# Patient Record
Sex: Female | Born: 1995 | Race: White | Hispanic: No | Marital: Single | Smoking: Current every day smoker
Health system: Southern US, Community
[De-identification: ages and names within clinical notes are randomized; demographics above are authoritative.]

## PROBLEM LIST (undated history)

## (undated) ENCOUNTER — Inpatient Hospital Stay (HOSPITAL_COMMUNITY): Payer: Self-pay

## (undated) DIAGNOSIS — F319 Bipolar disorder, unspecified: Secondary | ICD-10-CM

## (undated) DIAGNOSIS — F329 Major depressive disorder, single episode, unspecified: Secondary | ICD-10-CM

## (undated) DIAGNOSIS — L905 Scar conditions and fibrosis of skin: Secondary | ICD-10-CM

## (undated) DIAGNOSIS — F32A Depression, unspecified: Secondary | ICD-10-CM

## (undated) DIAGNOSIS — B999 Unspecified infectious disease: Secondary | ICD-10-CM

## (undated) DIAGNOSIS — T50902A Poisoning by unspecified drugs, medicaments and biological substances, intentional self-harm, initial encounter: Secondary | ICD-10-CM

## (undated) DIAGNOSIS — B1920 Unspecified viral hepatitis C without hepatic coma: Secondary | ICD-10-CM

## (undated) DIAGNOSIS — F419 Anxiety disorder, unspecified: Secondary | ICD-10-CM

## (undated) DIAGNOSIS — B009 Herpesviral infection, unspecified: Secondary | ICD-10-CM

## (undated) HISTORY — DX: Anxiety disorder, unspecified: F41.9

## (undated) HISTORY — PX: APPENDECTOMY: SHX54

## (undated) HISTORY — PX: HERNIA REPAIR: SHX51

## (undated) HISTORY — PX: TONSILLECTOMY: SUR1361

## (undated) HISTORY — DX: Bipolar disorder, unspecified: F31.9

---
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
## 2015-10-08 ENCOUNTER — Encounter (HOSPITAL_COMMUNITY): Payer: Self-pay | Admitting: Emergency Medicine

## 2015-10-08 ENCOUNTER — Emergency Department (HOSPITAL_COMMUNITY)
Admission: EM | Admit: 2015-10-08 | Discharge: 2015-10-08 | Discharge status: Against Medical Advice | Payer: Medicaid Other | Attending: Emergency Medicine | Admitting: Emergency Medicine

## 2015-10-08 DIAGNOSIS — N611 Abscess of the breast and nipple: Secondary | ICD-10-CM | POA: Insufficient documentation

## 2015-10-08 NOTE — ED Notes (Signed)
Patient c/o abscess to right breast x2 weeks. Approximately 4" in width, redness and warm to touch, no drainage. Denies N/V, fever. Rates pain 8/10.

## 2015-10-09 ENCOUNTER — Emergency Department (HOSPITAL_BASED_OUTPATIENT_CLINIC_OR_DEPARTMENT_OTHER)
Admission: EM | Admit: 2015-10-09 | Discharge: 2015-10-10 | Disposition: A | Discharge status: Home / Self Care | Payer: Medicaid Other | Attending: Emergency Medicine | Admitting: Emergency Medicine

## 2015-10-09 ENCOUNTER — Encounter (HOSPITAL_BASED_OUTPATIENT_CLINIC_OR_DEPARTMENT_OTHER): Payer: Self-pay | Admitting: *Deleted

## 2015-10-09 DIAGNOSIS — J069 Acute upper respiratory infection, unspecified: Secondary | ICD-10-CM | POA: Insufficient documentation

## 2015-10-09 DIAGNOSIS — Z915 Personal history of self-harm: Secondary | ICD-10-CM | POA: Insufficient documentation

## 2015-10-09 DIAGNOSIS — Z79899 Other long term (current) drug therapy: Secondary | ICD-10-CM | POA: Insufficient documentation

## 2015-10-09 DIAGNOSIS — F329 Major depressive disorder, single episode, unspecified: Secondary | ICD-10-CM | POA: Insufficient documentation

## 2015-10-09 DIAGNOSIS — R000 Tachycardia, unspecified: Secondary | ICD-10-CM | POA: Insufficient documentation

## 2015-10-09 DIAGNOSIS — N611 Abscess of the breast and nipple: Secondary | ICD-10-CM | POA: Insufficient documentation

## 2015-10-09 DIAGNOSIS — Z880 Allergy status to penicillin: Secondary | ICD-10-CM | POA: Insufficient documentation

## 2015-10-09 DIAGNOSIS — B9789 Other viral agents as the cause of diseases classified elsewhere: Secondary | ICD-10-CM

## 2015-10-09 DIAGNOSIS — Z872 Personal history of diseases of the skin and subcutaneous tissue: Secondary | ICD-10-CM | POA: Insufficient documentation

## 2015-10-09 LAB — CBC
HEMATOCRIT: 36.6 % (ref 36.0–46.0)
HEMOGLOBIN: 12 g/dL (ref 12.0–15.0)
MCH: 29.2 pg (ref 26.0–34.0)
MCHC: 32.8 g/dL (ref 30.0–36.0)
MCV: 89.1 fL (ref 78.0–100.0)
Platelets: 390 10*3/uL (ref 150–400)
RBC: 4.11 MIL/uL (ref 3.87–5.11)
RDW: 11.9 % (ref 11.5–15.5)
WBC: 14.5 10*3/uL — ABNORMAL HIGH (ref 4.0–10.5)

## 2015-10-09 LAB — BASIC METABOLIC PANEL
Anion gap: 6 (ref 5–15)
BUN: 11 mg/dL (ref 6–20)
CHLORIDE: 105 mmol/L (ref 101–111)
CO2: 27 mmol/L (ref 22–32)
Calcium: 8.6 mg/dL — ABNORMAL LOW (ref 8.9–10.3)
Creatinine, Ser: 0.63 mg/dL (ref 0.44–1.00)
GFR calc Af Amer: >60 mL/min (ref >60)
GFR calc non Af Amer: >60 mL/min (ref >60)
GLUCOSE: 110 mg/dL — AB (ref 65–99)
POTASSIUM: 3.3 mmol/L — AB (ref 3.5–5.1)
Sodium: 138 mmol/L (ref 135–145)

## 2015-10-09 MED ORDER — FENTANYL CITRATE (PF) 100 MCG/2ML IJ SOLN: 50.0000 ug | Freq: Once | INTRAMUSCULAR | Status: DC

## 2015-10-09 MED ORDER — OXYCODONE-ACETAMINOPHEN 5-325 MG PO TABS
2.0000 | ORAL_TABLET | Freq: Once | ORAL | Status: AC
  Administered 2015-10-09: 2 via ORAL
  Filled 2015-10-09: qty 2

## 2015-10-09 MED ORDER — SODIUM CHLORIDE 0.9 % IV BOLUS (SEPSIS)
500.0000 mL | Freq: Once | INTRAVENOUS | Status: AC
  Administered 2015-10-09: 500 mL via INTRAVENOUS

## 2015-10-09 MED ORDER — OXYCODONE-ACETAMINOPHEN 5-325 MG PO TABS: 1.0000 | ORAL_TABLET | ORAL | Status: DC | PRN

## 2015-10-09 MED ORDER — CEFTRIAXONE SODIUM 1 G IJ SOLR
1.0000 g | Freq: Once | INTRAMUSCULAR | Status: AC
  Administered 2015-10-09: 1 g via INTRAMUSCULAR
  Filled 2015-10-09: qty 10

## 2015-10-09 MED ORDER — FENTANYL CITRATE (PF) 100 MCG/2ML IJ SOLN
25.0000 ug | Freq: Once | INTRAMUSCULAR | Status: AC
  Administered 2015-10-09: 25 ug via INTRAVENOUS
  Filled 2015-10-09: qty 2

## 2015-10-09 MED ORDER — CLINDAMYCIN HCL 150 MG PO CAPS: 450.0000 mg | ORAL_CAPSULE | Freq: Three times a day (TID) | ORAL | Status: DC

## 2015-10-09 MED ORDER — LIDOCAINE HCL (PF) 1 % IJ SOLN
INTRAMUSCULAR | Status: DC
Start: 2015-10-09 — End: 2015-10-10
  Filled 2015-10-09: qty 5

## 2015-10-09 NOTE — ED Notes (Signed)
Pt registration states patient states she is leaving. Pt informed of right to stay and be treated. Pt doesn't want to wait any longer.

## 2015-10-09 NOTE — Discharge Instructions (Signed)
1. Medications: Percocet, clindamycin, usual home medications 2. Treatment: rest, drink plenty of fluids, 3. Follow Up: Please call Dr. Ermalene SearingMartin's office at 9:10am TOMORROW to schedule further treatment

## 2015-10-09 NOTE — ED Provider Notes (Signed)
CSN: [id number]     Arrival date & time 10/09/15  1718 History   First MD Initiated Contact with Patient 10/09/15 2027     Chief Complaint  Patient presents with  . Abscess     (Consider location/radiation/quality/duration/timing/severity/associated sxs/prior Treatment) The history is provided by the patient, a significant other and medical records. No language interpreter was used.    Lisa Schmitt is a 19 y.o. female  with a hx of depression, suicide attempt presents to the Emergency Department complaining of gradual, persistent, progressively worsening right breast abscess onset 2 weeks ago. Associated symptoms include redness, swelling and tenderness to the area.  Patient denies treatments prior to arrival. Nothing makes it better and nothing makes it worse.  Pt denies fever, chills, headache, neck pain, chest pain, shortness of breath, abdominal pain, nausea, vomiting, diarrhea, weakness, dizziness, syncope.  Patient also endorses 24 hours of sneezing, cough, rhinorrhea, nasal congestion, sore throat and postnasal drip.       Past Medical History  Diagnosis Date  . Depression   . Suicide attempt by drug ingestion ([facility]CC) 03/2012  . Scar of abdomen 03/2012   Past Surgical History  Procedure Laterality Date  . Appendectomy  age 60 yrs.  . Scar revision  03/22/2012    Procedure: SCAR REVISION;  Surgeon: Wayland Denislaire Sanger, DO;  Location: [facility];  Service: Plastics;  Laterality: Right;  Excision painful abdominal appendix scar contracture   No family history on file. Social History  Substance Use Topics  . Smoking status: Passive Smoke Exposure - Never Smoker  . Smokeless tobacco: Never Used     Comment: outside smokers at home  . Alcohol Use: No   OB History    No data available     Review of Systems  Constitutional: Positive for fatigue. Negative for fever, chills and appetite change.  HENT: Positive for congestion, postnasal drip, rhinorrhea, sinus pressure  and sore throat. Negative for ear discharge, ear pain and mouth sores.   Eyes: Negative for visual disturbance.  Respiratory: Positive for cough. Negative for chest tightness, shortness of breath, wheezing and stridor.   Cardiovascular: Negative for chest pain, palpitations and leg swelling.  Gastrointestinal: Negative for nausea, vomiting, abdominal pain and diarrhea.  Genitourinary: Negative for dysuria, urgency, frequency and hematuria.  Musculoskeletal: Negative for myalgias, back pain, arthralgias and neck stiffness.  Skin: Negative for rash.       Right breast abscess  Neurological: Negative for syncope, light-headedness, numbness and headaches.  Hematological: Negative for adenopathy.  Psychiatric/Behavioral: The patient is not nervous/anxious.   All other systems reviewed and are negative.     Allergies  Vicodin and Penicillins  Home Medications   Prior to Admission medications   Medication Sig Start Date End Date Taking? Authorizing Provider  ARIPiprazole (ABILIFY) 2 MG tablet Take 2 mg by mouth daily.    Historical Provider, MD  atomoxetine (STRATTERA) 80 MG capsule Take 80 mg by mouth daily.    Historical Provider, MD  citalopram (CELEXA) 40 MG tablet Take 40 mg by mouth daily.    Historical Provider, MD  clindamycin (CLEOCIN) 150 MG capsule Take 3 capsules (450 mg total) by mouth 3 (three) times daily. 10/09/15   Kitana Gage, PA-C  oxyCODONE-acetaminophen (PERCOCET) 5-325 MG tablet Take 1-2 tablets by mouth every 4 (four) hours as needed. 10/09/15   Yamaira Spinner, PA-C  topiramate (TOPAMAX) 50 MG tablet Take 50 mg by mouth 2 (two) times daily.    Historical Provider, MD  traZODone (DESYREL) 50 MG tablet Take 50 mg by mouth at bedtime.    Historical Provider, MD   BP 105/51 mmHg  Pulse 110  Temp(Src) 98.4 F (36.9 C) (Oral)  Resp 18  Ht  (1.575 m)  Wt 115 lb (52.164 kg)  BMI 21.03 kg/m2  SpO2 98%  LMP 10/01/2015 Physical Exam  Constitutional: She  is oriented to person, place, and time. She appears well-developed and well-nourished. No distress.  HENT:  Head: Normocephalic and atraumatic.  Right Ear: Tympanic membrane, external ear and ear canal normal.  Left Ear: Tympanic membrane, external ear and ear canal normal.  Nose: Mucosal edema and rhinorrhea present. No epistaxis. Right sinus exhibits no maxillary sinus tenderness and no frontal sinus tenderness. Left sinus exhibits no maxillary sinus tenderness and no frontal sinus tenderness.  Mouth/Throat: Uvula is midline, oropharynx is clear and moist and mucous membranes are normal. Mucous membranes are not pale and not cyanotic. No oropharyngeal exudate, posterior oropharyngeal edema, posterior oropharyngeal erythema or tonsillar abscesses.  Eyes: Conjunctivae are normal. Pupils are equal, round, and reactive to light.  Neck: Normal range of motion and full passive range of motion without pain.  Cardiovascular: Normal rate and intact distal pulses.   Pulmonary/Chest: Effort normal and breath sounds normal. No stridor.  Clear and equal breath sounds without focal wheezes, rhonchi, rales  Abdominal: Soft. Bowel sounds are normal. There is no tenderness.  Musculoskeletal: Normal range of motion.  Lymphadenopathy:    She has no cervical adenopathy.  Neurological: She is alert and oriented to person, place, and time.  Skin: Skin is warm and dry. No rash noted. She is not diaphoretic. There is erythema.  3 x 6 cm area of induration and fluctuance to the lower inner quadrant of the right breast with extension into the areola  Psychiatric: She has a normal mood and affect.  Nursing note and vitals reviewed.   ED Course  Procedures (including critical care time) Labs Review Labs Reviewed  CBC  BASIC METABOLIC PANEL    Imaging Review No results found. I have personally reviewed and evaluated these images and lab results as part of my medical decision-making.   EKG  Interpretation None       EMERGENCY DEPARTMENT US SOFT TISSUE INTERPRETATION "Study: Limited Ultrasound of the noted body part in comments below"  INDICATIONS: Pain and Soft tissue infection Multiple views of the body part are obtained with a multi-frequency linear probe  PERFORMED BY:  Myself  IMAGES ARCHIVED?: Yes  SIDE:Right   BODY PART:Breast  FINDINGS: Abcess present  LIMITATIONS:  Body Habitus  INTERPRETATION:  Abcess present  COMMENT:  Large abscess noted on ultrasound of the right breast    MDM   Final diagnoses:  Abscess of right breast  Viral URI with cough   Lisa Slate presents with right breast abscess.  Patient is well-appearing. In triage she was tachycardic however no tachycardia on physical exam. No hypotension or fever noted.  9:37 PM Discussed with Dr. Daphine Deutscher who recommends Rocephin 1g IM and d/c home with clindamycin.  She is to call his office at 9:10am and they will schedule a drainage tomorrow.  Patient pain controlled in the ED.  Strict return precautions given.  10:21 PM Pt remains tachycardic.  Will check basic labs, give fluid and further pain control.  Care transferred to Dr. Cyndie Chime at shift change.  Anticipate discharge home with surgery follow-up in the AM.  BP 105/51 mmHg  Pulse 110  Temp(Src)  98.4 F (36.9 C) (Oral)  Resp 18  Ht  (1.575 m)  Wt 115 lb (52.164 kg)  BMI 21.03 kg/m2  SpO2 98%  LMP 10/01/2015   Dierdre Forth, PA-C 10/09/15 2244  Leta Baptist, MD 10/10/15 [phone]

## 2015-10-09 NOTE — ED Notes (Signed)
Abscess to her right breast x 2 weeks. Red, painful, swollen.

## 2015-10-09 NOTE — ED Notes (Signed)
Pt given warm blanket. Pt states she doesn't feel right. Patient on phone and friend at side. Explained to patient wait and that I would be happy to check her vitals signs again. Pt states she thinks that the abscess burst inside her body.NAD noted at this time. This nurse will continue to monitor patient and symptoms.

## 2015-10-09 NOTE — ED Notes (Signed)
Pt informed of continued wait time. Pt resting with blanket and friend at side. NAD noted.

## 2015-10-10 MED ORDER — HYDROMORPHONE HCL 1 MG/ML IJ SOLN
1.0000 mg | Freq: Once | INTRAMUSCULAR | Status: AC
  Administered 2015-10-10: 1 mg via INTRAVENOUS
  Filled 2015-10-10: qty 1

## 2016-01-18 ENCOUNTER — Emergency Department (HOSPITAL_COMMUNITY): Payer: Self-pay

## 2016-01-18 ENCOUNTER — Emergency Department (HOSPITAL_COMMUNITY)
Admission: EM | Admit: 2016-01-18 | Discharge: 2016-01-18 | Disposition: A | Discharge status: Home / Self Care | Payer: Self-pay | Attending: Emergency Medicine | Admitting: Emergency Medicine

## 2016-01-18 ENCOUNTER — Encounter (HOSPITAL_COMMUNITY): Payer: Self-pay

## 2016-01-18 DIAGNOSIS — N12 Tubulo-interstitial nephritis, not specified as acute or chronic: Secondary | ICD-10-CM | POA: Insufficient documentation

## 2016-01-18 DIAGNOSIS — Z8659 Personal history of other mental and behavioral disorders: Secondary | ICD-10-CM | POA: Insufficient documentation

## 2016-01-18 DIAGNOSIS — Z915 Personal history of self-harm: Secondary | ICD-10-CM | POA: Insufficient documentation

## 2016-01-18 DIAGNOSIS — Z3202 Encounter for pregnancy test, result negative: Secondary | ICD-10-CM | POA: Insufficient documentation

## 2016-01-18 DIAGNOSIS — Z880 Allergy status to penicillin: Secondary | ICD-10-CM | POA: Insufficient documentation

## 2016-01-18 DIAGNOSIS — Z872 Personal history of diseases of the skin and subcutaneous tissue: Secondary | ICD-10-CM | POA: Insufficient documentation

## 2016-01-18 LAB — CBC
HEMATOCRIT: 38.1 % (ref 36.0–46.0)
Hemoglobin: 12.1 g/dL (ref 12.0–15.0)
MCH: 28.7 pg (ref 26.0–34.0)
MCHC: 31.8 g/dL (ref 30.0–36.0)
MCV: 90.5 fL (ref 78.0–100.0)
PLATELETS: 354 10*3/uL (ref 150–400)
RBC: 4.21 MIL/uL (ref 3.87–5.11)
RDW: 13.8 % (ref 11.5–15.5)
WBC: 17.8 10*3/uL — AB (ref 4.0–10.5)

## 2016-01-18 LAB — BASIC METABOLIC PANEL
Anion gap: 12 (ref 5–15)
BUN: 12 mg/dL (ref 6–20)
CALCIUM: 9.1 mg/dL (ref 8.9–10.3)
CO2: 25 mmol/L (ref 22–32)
Chloride: 102 mmol/L (ref 101–111)
Creatinine, Ser: 0.58 mg/dL (ref 0.44–1.00)
GFR calc Af Amer: >60 mL/min (ref >60)
Glucose, Bld: 101 mg/dL — ABNORMAL HIGH (ref 65–99)
POTASSIUM: 4.7 mmol/L (ref 3.5–5.1)
SODIUM: 139 mmol/L (ref 135–145)

## 2016-01-18 LAB — URINALYSIS, ROUTINE W REFLEX MICROSCOPIC
Bilirubin Urine: NEGATIVE
Glucose, UA: NEGATIVE mg/dL
Ketones, ur: NEGATIVE mg/dL
Nitrite: POSITIVE — AB
PROTEIN: 30 mg/dL — AB
Specific Gravity, Urine: 1.013 (ref 1.005–1.030)
pH: 7 (ref 5.0–8.0)

## 2016-01-18 LAB — URINE MICROSCOPIC-ADD ON

## 2016-01-18 LAB — HEPATIC FUNCTION PANEL
ALBUMIN: 3.2 g/dL — AB (ref 3.5–5.0)
ALK PHOS: 76 U/L (ref 38–126)
ALT: 14 U/L (ref 14–54)
AST: 27 U/L (ref 15–41)
Bilirubin, Direct: 0.3 mg/dL (ref 0.1–0.5)
Indirect Bilirubin: 0.4 mg/dL (ref 0.3–0.9)
TOTAL PROTEIN: 6.1 g/dL — AB (ref 6.5–8.1)
Total Bilirubin: 0.7 mg/dL (ref 0.3–1.2)

## 2016-01-18 LAB — POC URINE PREG, ED: PREG TEST UR: NEGATIVE

## 2016-01-18 MED ORDER — HYDROMORPHONE HCL 1 MG/ML IJ SOLN
1.0000 mg | Freq: Once | INTRAMUSCULAR | Status: AC
  Administered 2016-01-18: 1 mg via INTRAVENOUS
  Filled 2016-01-18: qty 1

## 2016-01-18 MED ORDER — OXYCODONE-ACETAMINOPHEN 5-325 MG PO TABS: 1.0000 | ORAL_TABLET | ORAL | Status: DC | PRN

## 2016-01-18 MED ORDER — CIPROFLOXACIN HCL 500 MG PO TABS: 500.0000 mg | ORAL_TABLET | Freq: Two times a day (BID) | ORAL | Status: DC

## 2016-01-18 MED ORDER — ONDANSETRON HCL 4 MG PO TABS: 4.0000 mg | ORAL_TABLET | Freq: Four times a day (QID) | ORAL | Status: DC

## 2016-01-18 MED ORDER — DEXTROSE 5 % IV SOLN
1.0000 g | Freq: Once | INTRAVENOUS | Status: AC
  Administered 2016-01-18: 1 g via INTRAVENOUS
  Filled 2016-01-18: qty 10

## 2016-01-18 MED ORDER — SODIUM CHLORIDE 0.9 % IV BOLUS (SEPSIS)
1000.0000 mL | Freq: Once | INTRAVENOUS | Status: AC
  Administered 2016-01-18: 1000 mL via INTRAVENOUS

## 2016-01-18 MED ORDER — ONDANSETRON HCL 4 MG/2ML IJ SOLN
4.0000 mg | Freq: Once | INTRAMUSCULAR | Status: AC
  Administered 2016-01-18: 4 mg via INTRAVENOUS
  Filled 2016-01-18: qty 2

## 2016-01-18 NOTE — ED Provider Notes (Signed)
CSN: [id number]     Arrival date & time 01/18/16  4098 History   First MD Initiated Contact with Patient 01/18/16 0601     Chief Complaint  Patient presents with  . Flank Pain     (Consider location/radiation/quality/duration/timing/severity/associated sxs/prior Treatment) HPI Lisa Schmitt is a 20 y.o. 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 She reports gradual worsening pain of her right upper back that radiates into her right leg. She reports pain is constant but is intermittently sharp and excruciating. Hunching over and seems to lessen symptom severity. Nothing seems to make it better otherwise. Movement and palpation worsen her discomfort. She does report painful urination with one episode of emesis earlier yesterday, nonbloody and nonbilious. She denies any fevers at home, abdominal pain, numbness or weakness. No other modifying factors.  Past Medical History  Diagnosis Date  . Depression   . Suicide attempt by drug ingestion (HCC) 03/2012  . Scar of abdomen 03/2012   Past Surgical History  Procedure Laterality Date  . Appendectomy  age 69 yrs.  . Scar revision  03/22/2012    Procedure: SCAR REVISION;  Surgeon: Wayland Denis, DO;  Location: [facility];  Service: Plastics;  Laterality: Right;  Excision painful abdominal appendix scar contracture   No family history on file. Social History  Substance Use Topics  . Smoking status: Passive Smoke Exposure - Never Smoker  . Smokeless tobacco: Never Used     Comment: outside smokers at home  . Alcohol Use: No   OB History    No data available     Review of Systems A 10 point review of systems was completed and was negative except for pertinent positives and negatives as mentioned in the history of present illness     Allergies  Vicodin and Penicillins  Home Medications   Prior to Admission  medications   Medication Sig Start Date End Date Taking? Authorizing Provider  acetaminophen (TYLENOL) 500 MG tablet Take 1,000-1,500 mg by mouth every 6 (six) hours as needed for mild pain.   Yes Historical Provider, MD  Phenazopyridine HCl (AZO TABS PO) Take 1 tablet by mouth daily as needed (pain).   Yes Historical Provider, MD  ciprofloxacin (CIPRO) 500 MG tablet Take 1 tablet (500 mg total) by mouth 2 (two) times daily. 01/18/16   Joycie Peek, PA-C  ondansetron (ZOFRAN) 4 MG tablet Take 1 tablet (4 mg total) by mouth every 6 (six) hours. 01/18/16   Joycie Peek, PA-C  oxyCODONE-acetaminophen (PERCOCET/ROXICET) 5-325 MG tablet Take 1-2 tablets by mouth every 4 (four) hours as needed for severe pain. 01/18/16   Joycie Peek, PA-C   BP 117/81 mmHg  Pulse 102  Temp(Src) 97.7 F (36.5 C) (Oral)  Resp 13  Ht  (1.575 m)  Wt 65.772 kg  BMI 26.51 kg/m2  SpO2 98%  LMP 12/21/2015 Physical Exam  Constitutional: She is oriented to person, place, and time. She appears well-developed and well-nourished.  HENT:  Head: Normocephalic and atraumatic.  Mouth/Throat: Oropharynx is clear and moist.  Eyes: Conjunctivae are normal. Pupils are equal, round, and reactive to light. Right eye exhibits no discharge. Left eye exhibits no discharge. No scleral icterus.  Neck: Neck supple.  Cardiovascular: Normal rate, regular rhythm and normal heart sounds.   Pulmonary/Chest: Effort normal and breath sounds normal. No respiratory distress. She has no wheezes. She  has no rales.  Abdominal: Soft. She exhibits no distension. There is no rebound and no guarding.  Diffuse suprapubic tenderness. Abdomen is otherwise soft, nondistended without rebound or guarding  Musculoskeletal:  Tenderness diffusely throughout right paraspinal musculature specifically over thoracic spine. No crepitus, erythema or other abnormalities noted. Gait is antalgic but no ataxia. Able to stand on toes without difficulty.  Difficult to appreciate right-sided CVA tenderness.  Neurological: She is alert and oriented to person, place, and time. No cranial nerve deficit. Coordination normal.  Cranial Nerves II-XII grossly intact. Motor strength 5/5 all 4 extremities. Sensation intact to light touch  Skin: Skin is warm and dry. No rash noted.  Psychiatric: She has a normal mood and affect.  Nursing note and vitals reviewed.   ED Course  Procedures (including critical care time) Labs Review Labs Reviewed  URINALYSIS, ROUTINE W REFLEX MICROSCOPIC (NOT AT [facility]) - Abnormal; Notable for the following:    Color, Urine ORANGE (*)    APPearance CLOUDY (*)    Hgb urine dipstick LARGE (*)    Protein, ur 30 (*)    Nitrite POSITIVE (*)    Leukocytes, UA LARGE (*)    All other components within normal limits  BASIC METABOLIC PANEL - Abnormal; Notable for the following:    Glucose, Bld 101 (*)    All other components within normal limits  CBC - Abnormal; Notable for the following:    WBC 17.8 (*)    All other components within normal limits  HEPATIC FUNCTION PANEL - Abnormal; Notable for the following:    Total Protein 6.1 (*)    Albumin 3.2 (*)    All other components within normal limits  URINE MICROSCOPIC-ADD ON - Abnormal; Notable for the following:    Squamous Epithelial / LPF 6-30 (*)    Bacteria, UA MANY (*)    All other components within normal limits  URINE CULTURE  POC URINE PREG, ED    Imaging Review Ct Renal Stone Study  01/18/2016  CLINICAL DATA:  Left flank pain for 1 week.  Pain with urination. EXAM: CT ABDOMEN AND PELVIS WITHOUT CONTRAST TECHNIQUE: Multidetector CT imaging of the abdomen and pelvis was performed following the standard protocol without IV contrast. COMPARISON:  10/06/2011 FINDINGS: Lower chest: Peripheral densities at both lung bases likely related to atelectasis. Hepatobiliary: Normal appearance of liver and gallbladder. Pancreas: Normal appearance of the pancreas without  inflammation or duct dilatation. Spleen: Normal appearance of the spleen without enlargement. Adrenals/Urinary Tract: Normal appearance of the adrenal glands. Normal appearance of both kidneys without hydronephrosis or stones. There is concern for bladder wall thickening, particularly along the anterior wall, measuring up to 1 cm. Stomach/Bowel: Large amount of stool throughout the colon. No gross abnormality to the stomach or small bowel. Evidence for an appendectomy. Vascular/Lymphatic: No significant atherosclerotic calcifications in the aorta or iliac arteries. Negative for an aortic aneurysm. No significant lymphadenopathy. Reproductive: Limited evaluation of the uterus and adnexa on this non contrast examination. Other: Negative for free fluid. No free air. Probable postsurgical changes along the anterior abdominal wall near the umbilicus. Musculoskeletal:  No acute abnormality. IMPRESSION: Diffuse wall thickening in the urinary bladder. Findings raise concern for bladder inflammation and cystitis. Negative for kidney stones or hydronephrosis. Electronically Signed   By: Richarda Overlie M.D.   On: 01/18/2016 08:13   I have personally reviewed and evaluated these images and lab results as part of my medical decision-making.   EKG Interpretation None  Meds given in ED:  Medications  sodium chloride 0.9 % bolus 1,000 mL (0 mLs Intravenous Stopped 01/18/16 0843)  HYDROmorphone (DILAUDID) injection 1 mg (1 mg Intravenous Given 01/18/16 0644)  HYDROmorphone (DILAUDID) injection 1 mg (1 mg Intravenous Given 01/18/16 0842)  ondansetron (ZOFRAN) injection 4 mg (4 mg Intravenous Given 01/18/16 0842)  cefTRIAXone (ROCEPHIN) 1 g in dextrose 5 % 50 mL IVPB (0 g Intravenous Stopped 01/18/16 0913)    Discharge Medication List as of 01/18/2016  8:32 AM    START taking these medications   Details  ciprofloxacin (CIPRO) 500 MG tablet Take 1 tablet (500 mg total) by mouth 2 (two) times daily., Starting 01/18/2016,  Until Discontinued, Print    ondansetron (ZOFRAN) 4 MG tablet Take 1 tablet (4 mg total) by mouth every 6 (six) hours., Starting 01/18/2016, Until Discontinued, Print    oxyCODONE-acetaminophen (PERCOCET/ROXICET) 5-325 MG tablet Take 1-2 tablets by mouth every 4 (four) hours as needed for severe pain., Starting 01/18/2016, Until Discontinued, Print       Filed Vitals:   01/18/16 0536 01/18/16 0545 01/18/16 0845 01/18/16 0915  BP:   121/82 117/81  Pulse:  126 107 102  Temp:      TempSrc:      Resp:   13 13  Height:      Weight: 65.772 kg     SpO2:  98% 98% 98%    MDM  Lisa Schmitt is a 20 y.o. female who presents for evaluation of right-sided flank and back pain as well as painful urination. Patient does have some tenderness along her paraspinal musculature, however has a nonfocal neuro exam. Due to patient's history of IV drug abuse, epidural abscess was considered but this is unlikely. She also has some suprapubic tenderness, urinalysis shows evidence of infection. Leukocytosis 17.8. Pregnancy is negative. Labs are otherwise unremarkable. CT renal stone study shows evidence of bladder wall thickening with no stone or hydronephrosis. Due to evidence of UTI, patient reported nausea and vomiting with associated flank pain, will treat empirically for pyelonephritis. Urine culture obtained. Given 1 g of Rocephin in the ED. Plan to discharge with Cipro. Patient reports she feels much better. Discussed ED course, results as well as discharge instructions and return precautions with her and family member at bedside. They both verbalize understanding and agreement with this plan. Final diagnoses:  Pyelonephritis        Joycie Peek, PA-C 01/18/16 1607  Dione Booze, MD 01/20/16 [phone]

## 2016-01-18 NOTE — Discharge Instructions (Signed)
You were evaluated in the ED today and diagnosed with pyelonephritis which is a kidney infection. You were treated with antibiotics in the emergency department and will be discharged with antibiotics. Take all of your antibiotics as prescribed, do not save or share them. Usual pain medicine as directed for severe pain, Motrin and Tylenol for mild to moderate pain. Return to ED for new or worsening symptoms as we discussed, including, but not limited to fever, intractable nausea and vomiting, worsening pain or other concerning symptoms.  Pyelonephritis, Adult Pyelonephritis is a kidney infection. The kidneys are the organs that filter a person's blood and move waste out of the bloodstream and into the urine. Urine passes from the kidneys, through the ureters, and into the bladder. There are two main types of pyelonephritis:  Infections that come on quickly without any warning (acute pyelonephritis).  Infections that last for a long period of time (chronic pyelonephritis). In most cases, the infection clears up with treatment and does not cause further problems. More severe infections or chronic infections can sometimes spread to the bloodstream or lead to other problems with the kidneys. CAUSES This condition is usually caused by:  Bacteria traveling from the bladder to the kidney through infected urine. The urine in the bladder can become infected with bacteria from:  Bladder infection (cystitis).  Inflammation of the prostate gland (prostatitis).  Sexual intercourse, in females.  Bacteria traveling from the bloodstream to the kidney. RISK FACTORS This condition is more likely to develop in:  Pregnant women.  Older people.  People who have diabetes.  People who have kidney stones or bladder stones.  People who have other abnormalities of the kidney or ureter.  People who have a catheter placed in the bladder.  People who have cancer.  People who are sexually active.  Women who  use spermicides.  People who have had a prior urinary tract infection. SYMPTOMS Symptoms of this condition include:  Frequent urination.  Strong or persistent urge to urinate.  Burning or stinging when urinating.  Abdominal pain.  Back pain.  Pain in the side or flank area.  Fever.  Chills.  Blood in the urine, or dark urine.  Nausea.  Vomiting. DIAGNOSIS This condition may be diagnosed based on:  Medical history and physical exam.  Urine tests.  Blood tests. You may also have imaging tests of the kidneys, such as an ultrasound or CT scan. TREATMENT Treatment for this condition may depend on the severity of the infection.  If the infection is mild and is found early, you may be treated with antibiotic medicines taken by mouth. You will need to drink fluids to remain hydrated.  If the infection is more severe, you may need to stay in the hospital and receive antibiotics given directly into a vein through an IV tube. You may also need to receive fluids through an IV tube if you are not able to remain hydrated. After your hospital stay, you may need to take oral antibiotics for a period of time. Other treatments may be required, depending on the cause of the infection. HOME CARE INSTRUCTIONS Medicines  Take over-the-counter and prescription medicines only as told by your health care provider.  If you were prescribed an antibiotic medicine, take it as told by your health care provider. Do not stop taking the antibiotic even if you start to feel better. General Instructions  Drink enough fluid to keep your urine clear or pale yellow.  Avoid caffeine, tea, and carbonated beverages. They  tend to irritate the bladder.  Urinate often. Avoid holding in urine for long periods of time.  Urinate before and after sex.  After a bowel movement, women should cleanse from front to back. Use each tissue only once.  Keep all follow-up visits as told by your health care  provider. This is important. SEEK MEDICAL CARE IF:  Your symptoms do not get better after 2 days of treatment.  Your symptoms get worse.  You have a fever. SEEK IMMEDIATE MEDICAL CARE IF:  You are unable to take your antibiotics or fluids.  You have shaking chills.  You vomit.  You have severe flank or back pain.  You have extreme weakness or fainting.   This information is not intended to replace advice given to you by your health care provider. Make sure you discuss any questions you have with your health care provider.   Document Released: 11/17/2005 Document Revised: 08/08/2015 Document Reviewed: 03/12/2015 Elsevier Interactive Patient Education [employer]

## 2016-01-18 NOTE — ED Notes (Signed)
Pt c/o of R sided flank pain that has been going on for about one week, worse tonight, vomited x 1 tonight, hx of kidney stones, c/o frequency and urgency.

## 2016-01-21 LAB — URINE CULTURE: Culture: >=100000

## 2016-01-22 ENCOUNTER — Telehealth (HOSPITAL_BASED_OUTPATIENT_CLINIC_OR_DEPARTMENT_OTHER): Payer: Self-pay | Admitting: Emergency Medicine

## 2016-01-22 NOTE — Telephone Encounter (Signed)
Post ED Visit - Positive Culture Follow-up  Culture report reviewed by antimicrobial stewardship pharmacist:   Enzo Bi, Pharm.D.  Celedonio Miyamoto, Pharm.D., BCPS  Garvin Fila, Pharm.D.  Georgina Pillion, Pharm.D., BCPS  [place], [address].D., BCPS, AAHIVP  Estella Husk, Pharm.D., BCPS, AAHIVP  Tennis Must, Pharm.D.  Sherle Poe, [place].D.  Positive urine culture E. coli Treated with ciprofloxacin, organism sensitive to the same and no further patient follow-up is required at this time.  Berle Mull 01/22/2016, 9:48 AM

## 2016-01-29 ENCOUNTER — Emergency Department (HOSPITAL_COMMUNITY): Payer: Medicaid Other

## 2016-01-29 ENCOUNTER — Emergency Department (HOSPITAL_COMMUNITY)
Admission: EM | Admit: 2016-01-29 | Discharge: 2016-01-30 | Disposition: A | Discharge status: Home / Self Care | Payer: Medicaid Other | Attending: Emergency Medicine | Admitting: Emergency Medicine

## 2016-01-29 ENCOUNTER — Encounter (HOSPITAL_COMMUNITY): Payer: Self-pay | Admitting: Emergency Medicine

## 2016-01-29 DIAGNOSIS — F1310 Sedative, hypnotic or anxiolytic abuse, uncomplicated: Secondary | ICD-10-CM | POA: Insufficient documentation

## 2016-01-29 DIAGNOSIS — R3915 Urgency of urination: Secondary | ICD-10-CM | POA: Insufficient documentation

## 2016-01-29 DIAGNOSIS — R200 Anesthesia of skin: Secondary | ICD-10-CM | POA: Diagnosis not present

## 2016-01-29 DIAGNOSIS — R1084 Generalized abdominal pain: Secondary | ICD-10-CM | POA: Diagnosis present

## 2016-01-29 DIAGNOSIS — G4489 Other headache syndrome: Secondary | ICD-10-CM | POA: Diagnosis not present

## 2016-01-29 DIAGNOSIS — F1210 Cannabis abuse, uncomplicated: Secondary | ICD-10-CM | POA: Diagnosis not present

## 2016-01-29 DIAGNOSIS — Z880 Allergy status to penicillin: Secondary | ICD-10-CM | POA: Diagnosis not present

## 2016-01-29 DIAGNOSIS — R079 Chest pain, unspecified: Secondary | ICD-10-CM | POA: Diagnosis not present

## 2016-01-29 DIAGNOSIS — Z915 Personal history of self-harm: Secondary | ICD-10-CM | POA: Insufficient documentation

## 2016-01-29 DIAGNOSIS — F1910 Other psychoactive substance abuse, uncomplicated: Secondary | ICD-10-CM

## 2016-01-29 DIAGNOSIS — F1510 Other stimulant abuse, uncomplicated: Secondary | ICD-10-CM | POA: Insufficient documentation

## 2016-01-29 DIAGNOSIS — R05 Cough: Secondary | ICD-10-CM | POA: Insufficient documentation

## 2016-01-29 DIAGNOSIS — F17210 Nicotine dependence, cigarettes, uncomplicated: Secondary | ICD-10-CM | POA: Diagnosis not present

## 2016-01-29 DIAGNOSIS — Z872 Personal history of diseases of the skin and subcutaneous tissue: Secondary | ICD-10-CM | POA: Diagnosis not present

## 2016-01-29 DIAGNOSIS — Z3202 Encounter for pregnancy test, result negative: Secondary | ICD-10-CM | POA: Insufficient documentation

## 2016-01-29 DIAGNOSIS — R0602 Shortness of breath: Secondary | ICD-10-CM | POA: Insufficient documentation

## 2016-01-29 DIAGNOSIS — M549 Dorsalgia, unspecified: Secondary | ICD-10-CM | POA: Diagnosis not present

## 2016-01-29 LAB — COMPREHENSIVE METABOLIC PANEL
ALK PHOS: 65 U/L (ref 38–126)
ALT: 177 U/L — ABNORMAL HIGH (ref 14–54)
ANION GAP: 15 (ref 5–15)
AST: 106 U/L — ABNORMAL HIGH (ref 15–41)
Albumin: 4.5 g/dL (ref 3.5–5.0)
BUN: 21 mg/dL — ABNORMAL HIGH (ref 6–20)
CALCIUM: 9.8 mg/dL (ref 8.9–10.3)
CO2: 23 mmol/L (ref 22–32)
Chloride: 103 mmol/L (ref 101–111)
Creatinine, Ser: 0.84 mg/dL (ref 0.44–1.00)
Glucose, Bld: 93 mg/dL (ref 65–99)
Potassium: 3.7 mmol/L (ref 3.5–5.1)
SODIUM: 141 mmol/L (ref 135–145)
Total Bilirubin: 1.1 mg/dL (ref 0.3–1.2)
Total Protein: 8 g/dL (ref 6.5–8.1)

## 2016-01-29 LAB — CBC
HCT: 39.8 % (ref 36.0–46.0)
HEMOGLOBIN: 13 g/dL (ref 12.0–15.0)
MCH: 29 pg (ref 26.0–34.0)
MCHC: 32.7 g/dL (ref 30.0–36.0)
MCV: 88.6 fL (ref 78.0–100.0)
Platelets: 380 10*3/uL (ref 150–400)
RBC: 4.49 MIL/uL (ref 3.87–5.11)
RDW: 13.6 % (ref 11.5–15.5)
WBC: 10.3 10*3/uL (ref 4.0–10.5)

## 2016-01-29 LAB — URINALYSIS, ROUTINE W REFLEX MICROSCOPIC
Glucose, UA: NEGATIVE mg/dL
HGB URINE DIPSTICK: NEGATIVE
Ketones, ur: 15 mg/dL — AB
NITRITE: NEGATIVE
PROTEIN: NEGATIVE mg/dL
SPECIFIC GRAVITY, URINE: 1.03 (ref 1.005–1.030)
pH: 5.5 (ref 5.0–8.0)

## 2016-01-29 LAB — URINE MICROSCOPIC-ADD ON

## 2016-01-29 LAB — LIPASE, BLOOD: LIPASE: 21 U/L (ref 11–51)

## 2016-01-29 LAB — POC URINE PREG, ED: PREG TEST UR: NEGATIVE

## 2016-01-29 MED ORDER — LORAZEPAM 2 MG/ML IJ SOLN
1.0000 mg | Freq: Once | INTRAMUSCULAR | Status: AC
  Administered 2016-01-30: 1 mg via INTRAVENOUS
  Filled 2016-01-29: qty 1

## 2016-01-29 MED ORDER — SODIUM CHLORIDE 0.9 % IV BOLUS (SEPSIS)
1000.0000 mL | Freq: Once | INTRAVENOUS | Status: AC
  Administered 2016-01-30: 1000 mL via INTRAVENOUS

## 2016-01-29 NOTE — ED Notes (Signed)
Pt reports being treated for UTI 1 week ago pt now has pain in right side of lower abd into right flank. Pt also states she feels like right and left leg feel numb.

## 2016-01-29 NOTE — ED Provider Notes (Signed)
CSN: [id number]     Arrival date & time 01/29/16  1942 History  By signing my name below, I, Iona Beard, attest that this documentation has been prepared under the direction and in the presence of Zadie Rhine, MD.   Electronically Signed: Iona Beard, ED Scribe. 01/29/2016. 1:42 AM     Chief Complaint  Patient presents with  . Abdominal Pain    Patient is a 20 y.o. female presenting with abdominal pain. The history is provided by the patient. No language interpreter was used.  Abdominal Pain Pain location:  Generalized Pain radiates to:  R shoulder, back, L leg and R leg (Pain and numbness radiate to right shoulder, generalized upper back, and bilateral feet. ) Pain severity:  Moderate Onset quality:  Gradual Chronicity:  Recurrent Context: recent illness   Relieved by:  Nothing Worsened by:  Nothing tried Ineffective treatments:  Acetaminophen Associated symptoms: cough and shortness of breath   Associated symptoms: no dysuria, no fever, no nausea and no vomiting   Cough:    Cough characteristics:  Unable to specify   Sputum characteristics:  Unable to specify   Severity:  Mild   Onset quality:  Gradual   Progression:  Unable to specify Shortness of breath:    Severity:  Mild   Timing:  Sporadic  HPI Comments: Lisa Schmitt is a 20 y.o. female with PMHx of UTI about 11 days ago and substance abuse who presents to the Emergency Department complaining of gradual onset, constant, lower abdominal pain, onset a few days ago. Pt reports associated moderate headache that is similar to prior HA, generalized upper back numbness, right shoulder numbness, urinary urgency, shortness of breath, and bilateral foot numbness. She reports the numbness has alleviated slightly in the ED. No worsening or alleviating factors noted. Pt denies dysuria, fever, vomiting, or any other pertinent symptoms.   She has h/o IVDA, she uses crystal meth.  Last IVDA a month ago She also  reports h/o migraines and seizures, and she used to be on topamax but not at this time  She denies abuse of percocet/vicodin She does not overuse APAP products   She reports recently completing cipro for UTI  Past Medical History  Diagnosis Date  . Depression   . Suicide attempt by drug ingestion (HCC) 03/2012  . Scar of abdomen 03/2012   Past Surgical History  Procedure Laterality Date  . Appendectomy  age 80 yrs.  . Scar revision  03/22/2012    Procedure: SCAR REVISION;  Surgeon: Wayland Denis, DO;  Location: [facility];  Service: Plastics;  Laterality: Right;  Excision painful abdominal appendix scar contracture   No family history on file. Social History  Substance Use Topics  . Smoking status: Current Every Day Smoker -- 1.00 packs/day    Types: Cigarettes  . Smokeless tobacco: Never Used     Comment: outside smokers at home  . Alcohol Use: No   OB History    No data available     Review of Systems  Constitutional: Negative for fever.  Respiratory: Positive for cough and shortness of breath.   Gastrointestinal: Positive for abdominal pain. Negative for nausea and vomiting.  Genitourinary: Positive for urgency. Negative for dysuria.  Musculoskeletal: Positive for back pain.  Neurological: Positive for numbness and headaches.       Numbness to right shoulder, generalized upper back, and bilateral feet.   All other systems reviewed and are negative.    Allergies  Vicodin and Penicillins  Home Medications   Prior to Admission medications   Medication Sig Start Date End Date Taking? Authorizing Provider  ciprofloxacin (CIPRO) 500 MG tablet Take 1 tablet (500 mg total) by mouth 2 (two) times daily. Patient not taking: Reported on 01/29/2016 01/18/16   Joycie Peek, PA-C  ondansetron (ZOFRAN) 4 MG tablet Take 1 tablet (4 mg total) by mouth every 6 (six) hours. Patient not taking: Reported on 01/29/2016 01/18/16   Joycie Peek, PA-C   oxyCODONE-acetaminophen (PERCOCET/ROXICET) 5-325 MG tablet Take 1-2 tablets by mouth every 4 (four) hours as needed for severe pain. Patient not taking: Reported on 01/29/2016 01/18/16   Joycie Peek, PA-C   BP 137/85 mmHg  Pulse 112  Temp(Src) 98.3 F (36.8 C) (Oral)  Resp 16  Ht 5\' 2"  (1.575 m)  Wt 145 lb (65.772 kg)  BMI 26.51 kg/m2  SpO2 96%  LMP 12/13/2015 Physical Exam CONSTITUTIONAL: disheveled and anxious  HEAD: Normocephalic/atraumatic EYES: EOMI ENMT: Mucous membranes moist NECK: supple no meningeal signs SPINE/BACK: Mild TTP to right scapula. No ecchymosis noted.  No bruising/crepitance/stepoffs noted to spine No spinal tenderness noted CV: S1/S2 noted, no murmurs/rubs/gallops noted LUNGS: Lungs are clear to auscultation bilaterally, no apparent distress ABDOMEN: soft, nontender, no rebound or guarding, bowel sounds noted throughout abdomen GU:no cva tenderness NEURO: Pt is awake/alertmoves all extremitiesx4.  No facial droop.  No arm/leg drift.  During exam pt had episodes of closing eyes and not speaking with some body twitching that quickly resolves and she is conscious/alert soon after  Pt is ambulatory without ataxia  Equal distal motor 5/5 strength noted with the following: hip flexion/knee flexion/extension, ankle dorsi/plantar flexion, great toe extension intact bilaterally, no clonus bilaterally, no sensory deficit in any dermatome.  Equal patellar/achilles reflex noted  in bilateral lower extremities.   EXTREMITIES: pulses normal/equal, full ROM SKIN: warm, color normal PSYCH: anxious  ED Course  Procedures  DIAGNOSTIC STUDIES: Oxygen Saturation is 96% on RA, adeuqate by my interpretation.    COORDINATION OF CARE: 11:31 PM-Discussed treatment plan which includes urinalysis, CBC, CMP, DG Chest, CT head without contrast, and urine micro with pt at bedside and pt agreed to plan.  Pt with confusing history Initial chief complaint was abd pain, but then  reports back pain, feet numbness, chest wall pain and cough She also reports chronic migraines She also has episodes of closing her eyes with some body jerking that resolves She has h/o seizures, but her history is complicated by recent crystal meth use Due to multiple complaints, I elected to perform CT head/CXR Labs thus far are unremarkable 1:50 AM CT imaging/CXR negative Pt improved No further episodes of closing her eyes/jerking per significant other She is awake/alert On my arrival to room her tachycardia had resolved, HR around 120 She is afebrile She has no neuro deficits to suggest acute neurologic or acute spinal emergency No signs of acute abdominal emergency Advised to quit using crystal meth Suspect most of her symptoms related to substance abuse We discussed strict return precautions  Labs Review Labs Reviewed  COMPREHENSIVE METABOLIC PANEL - Abnormal; Notable for the following:    BUN 21 (*)    AST 106 (*)    ALT 177 (*)    All other components within normal limits  URINALYSIS, ROUTINE W REFLEX MICROSCOPIC (NOT AT [facility]) - Abnormal; Notable for the following:    Color, Urine AMBER (*)    APPearance TURBID (*)    Bilirubin Urine SMALL (*)    Ketones, ur 15 (*)  Leukocytes, UA SMALL (*)    All other components within normal limits  URINE MICROSCOPIC-ADD ON - Abnormal; Notable for the following:    Squamous Epithelial / LPF 6-30 (*)    Bacteria, UA FEW (*)    All other components within normal limits  ACETAMINOPHEN LEVEL - Abnormal; Notable for the following:    Acetaminophen (Tylenol), Serum <10 (*)    All other components within normal limits  URINE RAPID DRUG SCREEN, HOSP PERFORMED - Abnormal; Notable for the following:    Benzodiazepines POSITIVE (*)    Amphetamines POSITIVE (*)    Tetrahydrocannabinol POSITIVE (*)    All other components within normal limits  LIPASE, BLOOD  CBC  SALICYLATE LEVEL  CK  POC URINE PREG, ED    Imaging Review Dg Chest  2 View  01/30/2016  CLINICAL DATA:  20 year old female with chest pain and shortness of breath EXAM: CHEST  2 VIEW COMPARISON:  Radiograph dated 01/03/2014 FINDINGS: The heart size and mediastinal contours are within normal limits. Both lungs are clear. The visualized skeletal structures are unremarkable. IMPRESSION: No active cardiopulmonary disease. Electronically Signed   By: Elgie Collard M.D.   On: 01/30/2016 00:15   Ct Head Wo Contrast  01/30/2016  CLINICAL DATA:  Acute onset of bilateral leg numbness. Initial encounter. EXAM: CT HEAD WITHOUT CONTRAST TECHNIQUE: Contiguous axial images were obtained from the base of the skull through the vertex without intravenous contrast. COMPARISON:  CT of the head performed 03/29/2010, and MRI of the brain performed 05/02/2010 FINDINGS: There is no evidence of acute infarction, mass lesion, or intra- or extra-axial hemorrhage on CT. The posterior fossa, including the cerebellum, brainstem and fourth ventricle, is within normal limits. The third and lateral ventricles, and basal ganglia are unremarkable in appearance. The cerebral hemispheres are symmetric in appearance, with normal gray-white differentiation. No mass effect or midline shift is seen. There is no evidence of fracture; visualized osseous structures are unremarkable in appearance. The visualized portions of the orbits are within normal limits. The paranasal sinuses and mastoid air cells are well-aerated. No significant soft tissue abnormalities are seen. IMPRESSION: Unremarkable noncontrast CT of the head. Electronically Signed   By: Roanna Raider M.D.   On: 01/30/2016 01:11   I have personally reviewed and evaluated these images and lab results as part of my medical decision-making.   EKG Interpretation   Date/Time:  Wednesday January 30 2016 00:17:48 EST Ventricular Rate:  97 PR Interval:  129 QRS Duration: 97 QT Interval:  365 QTC Calculation: 464 R Axis:   71 Text Interpretation:  Sinus  rhythm Atrial premature complexes Borderline Q  waves in lateral leads Borderline T abnormalities, anterior leads No  previous ECGs available Abnormal ekg Confirmed by Bebe Shaggy  MD, Abimael Zeiter  [phone]) on 01/30/2016 12:25:51 AM     Medications  sodium chloride 0.9 % bolus 1,000 mL (1,000 mLs Intravenous New Bag/Given 01/30/16 0011)  LORazepam (ATIVAN) injection 1 mg (1 mg Intravenous Given 01/30/16 0011)  LORazepam (ATIVAN) injection 1 mg (1 mg Intravenous Given 01/30/16 0119)  sodium chloride 0.9 % bolus 1,000 mL (1,000 mLs Intravenous New Bag/Given 01/30/16 0119)    MDM   Final diagnoses:  Generalized abdominal pain  Back pain, unspecified location  Other headache syndrome  Substance abuse  Chest pain, unspecified chest pain type    Nursing notes including past medical history and social history reviewed and considered in documentation xrays/imaging reviewed by myself and considered during evaluation Labs/vital reviewed myself and considered  during evaluation   I personally performed the services described in this documentation, which was scribed in my presence. The recorded information has been reviewed and is accurate.        Zadie Rhine, MD 01/30/16 [phone]

## 2016-01-30 ENCOUNTER — Emergency Department (HOSPITAL_COMMUNITY): Payer: Medicaid Other

## 2016-01-30 ENCOUNTER — Encounter (HOSPITAL_COMMUNITY): Payer: Self-pay | Admitting: Radiology

## 2016-01-30 LAB — RAPID URINE DRUG SCREEN, HOSP PERFORMED
Amphetamines: POSITIVE — AB
BARBITURATES: NOT DETECTED
BENZODIAZEPINES: POSITIVE — AB
Cocaine: NOT DETECTED
Opiates: NOT DETECTED
Tetrahydrocannabinol: POSITIVE — AB

## 2016-01-30 LAB — CK: CK TOTAL: 77 U/L (ref 38–234)

## 2016-01-30 LAB — SALICYLATE LEVEL

## 2016-01-30 LAB — ACETAMINOPHEN LEVEL

## 2016-01-30 MED ORDER — SODIUM CHLORIDE 0.9 % IV BOLUS (SEPSIS)
1000.0000 mL | Freq: Once | INTRAVENOUS | Status: AC
  Administered 2016-01-30: 1000 mL via INTRAVENOUS

## 2016-01-30 MED ORDER — LORAZEPAM 2 MG/ML IJ SOLN
1.0000 mg | Freq: Once | INTRAMUSCULAR | Status: AC
  Administered 2016-01-30: 1 mg via INTRAVENOUS
  Filled 2016-01-30: qty 1

## 2016-01-30 NOTE — ED Notes (Signed)
Patient transported to CT 

## 2016-01-30 NOTE — Discharge Instructions (Signed)
SEEK IMMEDIATE MEDICAL ATTENTION IF: New numbness, tingling, weakness, or problem with the use of your arms or legs.  Severe back pain not relieved with medications.  Change in bowel or bladder control (if you lose control of stool or urine, or if you are unable to urinate) Increasing pain in any areas of the body (such as chest or abdominal pain).  Shortness of breath, dizziness or fainting.  Nausea (feeling sick to your stomach), vomiting, fever, or sweats.   You are having a headache. No specific cause was found today for your headache. It may have been a migraine or other cause of headache. Stress, anxiety, fatigue, and depression are common triggers for headaches. Your headache today does not appear to be life-threatening or require hospitalization, but often the exact cause of headaches is not determined in the emergency department. Therefore, follow-up with your doctor is very important to find out what may have caused your headache, and whether or not you need any further diagnostic testing or treatment. Sometimes headaches can appear benign (not harmful), but then more serious symptoms can develop which should prompt an immediate re-evaluation by your doctor or the emergency department.  SEEK MEDICAL ATTENTION IF:  You develop possible problems with medications prescribed.  The medications don't resolve your headache, if it recurs , or if you have multiple episodes of vomiting or can't take fluids. You have a change from the usual headache.  RETURN IMMEDIATELY IF you develop a sudden, severe headache or confusion, become poorly responsive or faint, develop a fever above 100.54F or problem breathing, have a change in speech, vision, swallowing, or understanding, or develop new weakness, numbness, tingling, incoordination, or have a seizure.

## 2016-03-27 DIAGNOSIS — O219 Vomiting of pregnancy, unspecified: Secondary | ICD-10-CM | POA: Insufficient documentation

## 2016-03-27 DIAGNOSIS — O98419 Viral hepatitis complicating pregnancy, unspecified trimester: Secondary | ICD-10-CM

## 2016-03-27 DIAGNOSIS — B182 Chronic viral hepatitis C: Secondary | ICD-10-CM | POA: Insufficient documentation

## 2016-03-27 DIAGNOSIS — Z8659 Personal history of other mental and behavioral disorders: Secondary | ICD-10-CM | POA: Insufficient documentation

## 2016-03-27 DIAGNOSIS — Z872 Personal history of diseases of the skin and subcutaneous tissue: Secondary | ICD-10-CM | POA: Insufficient documentation

## 2016-04-24 DIAGNOSIS — O0990 Supervision of high risk pregnancy, unspecified, unspecified trimester: Secondary | ICD-10-CM

## 2016-08-06 ENCOUNTER — Inpatient Hospital Stay (HOSPITAL_COMMUNITY)
Admission: EM | Admit: 2016-08-06 | Discharge: 2016-08-14 | DRG: 781 | Disposition: A | Discharge status: Home / Self Care | Payer: Medicaid Other | Attending: Obstetrics and Gynecology | Admitting: Obstetrics and Gynecology

## 2016-08-06 ENCOUNTER — Encounter (HOSPITAL_COMMUNITY): Payer: Self-pay

## 2016-08-06 DIAGNOSIS — O99322 Drug use complicating pregnancy, second trimester: Secondary | ICD-10-CM | POA: Diagnosis present

## 2016-08-06 DIAGNOSIS — A6009 Herpesviral infection of other urogenital tract: Secondary | ICD-10-CM | POA: Diagnosis not present

## 2016-08-06 DIAGNOSIS — O0990 Supervision of high risk pregnancy, unspecified, unspecified trimester: Secondary | ICD-10-CM

## 2016-08-06 DIAGNOSIS — O98413 Viral hepatitis complicating pregnancy, third trimester: Secondary | ICD-10-CM | POA: Diagnosis present

## 2016-08-06 DIAGNOSIS — B009 Herpesviral infection, unspecified: Secondary | ICD-10-CM | POA: Diagnosis present

## 2016-08-06 DIAGNOSIS — O23593 Infection of other part of genital tract in pregnancy, third trimester: Secondary | ICD-10-CM | POA: Diagnosis present

## 2016-08-06 DIAGNOSIS — A6000 Herpesviral infection of urogenital system, unspecified: Secondary | ICD-10-CM | POA: Diagnosis present

## 2016-08-06 DIAGNOSIS — F17210 Nicotine dependence, cigarettes, uncomplicated: Secondary | ICD-10-CM | POA: Diagnosis present

## 2016-08-06 DIAGNOSIS — B182 Chronic viral hepatitis C: Secondary | ICD-10-CM | POA: Diagnosis present

## 2016-08-06 DIAGNOSIS — O99333 Smoking (tobacco) complicating pregnancy, third trimester: Secondary | ICD-10-CM | POA: Diagnosis present

## 2016-08-06 DIAGNOSIS — O2300 Infections of kidney in pregnancy, unspecified trimester: Secondary | ICD-10-CM | POA: Diagnosis not present

## 2016-08-06 DIAGNOSIS — O98513 Other viral diseases complicating pregnancy, third trimester: Secondary | ICD-10-CM | POA: Diagnosis not present

## 2016-08-06 DIAGNOSIS — N10 Acute tubulo-interstitial nephritis: Secondary | ICD-10-CM

## 2016-08-06 DIAGNOSIS — O98313 Other infections with a predominantly sexual mode of transmission complicating pregnancy, third trimester: Secondary | ICD-10-CM | POA: Diagnosis present

## 2016-08-06 DIAGNOSIS — O2303 Infections of kidney in pregnancy, third trimester: Secondary | ICD-10-CM | POA: Diagnosis present

## 2016-08-06 DIAGNOSIS — O99323 Drug use complicating pregnancy, third trimester: Secondary | ICD-10-CM | POA: Diagnosis present

## 2016-08-06 DIAGNOSIS — N12 Tubulo-interstitial nephritis, not specified as acute or chronic: Secondary | ICD-10-CM | POA: Diagnosis present

## 2016-08-06 DIAGNOSIS — F1910 Other psychoactive substance abuse, uncomplicated: Secondary | ICD-10-CM | POA: Diagnosis present

## 2016-08-06 DIAGNOSIS — O321XX0 Maternal care for breech presentation, not applicable or unspecified: Secondary | ICD-10-CM | POA: Diagnosis present

## 2016-08-06 DIAGNOSIS — Z3A28 28 weeks gestation of pregnancy: Secondary | ICD-10-CM | POA: Diagnosis not present

## 2016-08-06 DIAGNOSIS — O98519 Other viral diseases complicating pregnancy, unspecified trimester: Secondary | ICD-10-CM

## 2016-08-06 HISTORY — DX: Unspecified infectious disease: B99.9

## 2016-08-06 HISTORY — DX: Unspecified viral hepatitis C without hepatic coma: B19.20

## 2016-08-06 LAB — COMPREHENSIVE METABOLIC PANEL
ALBUMIN: 2.5 g/dL — AB (ref 3.5–5.0)
ALK PHOS: 62 U/L (ref 38–126)
ALT: 42 U/L (ref 14–54)
ANION GAP: 5 (ref 5–15)
AST: 29 U/L (ref 15–41)
BILIRUBIN TOTAL: 0.2 mg/dL — AB (ref 0.3–1.2)
CALCIUM: 7.9 mg/dL — AB (ref 8.9–10.3)
CO2: 24 mmol/L (ref 22–32)
Chloride: 106 mmol/L (ref 101–111)
Creatinine, Ser: 0.42 mg/dL — ABNORMAL LOW (ref 0.44–1.00)
GFR calc Af Amer: >60 mL/min (ref >60)
GLUCOSE: 83 mg/dL (ref 65–99)
Potassium: 3.2 mmol/L — ABNORMAL LOW (ref 3.5–5.1)
Sodium: 135 mmol/L (ref 135–145)
TOTAL PROTEIN: 5.3 g/dL — AB (ref 6.5–8.1)

## 2016-08-06 LAB — TYPE AND SCREEN
ABO/RH(D): O POS
ANTIBODY SCREEN: NEGATIVE

## 2016-08-06 LAB — URINALYSIS, ROUTINE W REFLEX MICROSCOPIC
Glucose, UA: NEGATIVE mg/dL
KETONES UR: 15 mg/dL — AB
NITRITE: POSITIVE — AB
PH: 6 (ref 5.0–8.0)
PROTEIN: 30 mg/dL — AB
Specific Gravity, Urine: 1.031 — ABNORMAL HIGH (ref 1.005–1.030)

## 2016-08-06 LAB — WET PREP, GENITAL
CLUE CELLS WET PREP: NONE SEEN
SPERM: NONE SEEN
Trich, Wet Prep: NONE SEEN
Yeast Wet Prep HPF POC: NONE SEEN

## 2016-08-06 LAB — RAPID URINE DRUG SCREEN, HOSP PERFORMED
AMPHETAMINES: POSITIVE — AB
BARBITURATES: NOT DETECTED
BENZODIAZEPINES: POSITIVE — AB
COCAINE: NOT DETECTED
Opiates: POSITIVE — AB
TETRAHYDROCANNABINOL: POSITIVE — AB

## 2016-08-06 LAB — CBC
HCT: 27.5 % — ABNORMAL LOW (ref 36.0–46.0)
HEMOGLOBIN: 9.4 g/dL — AB (ref 12.0–15.0)
MCH: 29.3 pg (ref 26.0–34.0)
MCHC: 34.2 g/dL (ref 30.0–36.0)
MCV: 85.7 fL (ref 78.0–100.0)
Platelets: 181 10*3/uL (ref 150–400)
RBC: 3.21 MIL/uL — ABNORMAL LOW (ref 3.87–5.11)
RDW: 13.1 % (ref 11.5–15.5)
WBC: 9.5 10*3/uL (ref 4.0–10.5)

## 2016-08-06 LAB — RAPID HIV SCREEN (HIV 1/2 AB+AG)
HIV 1/2 ANTIBODIES: NONREACTIVE
HIV-1 P24 Antigen - HIV24: NONREACTIVE

## 2016-08-06 LAB — ABO/RH: ABO/RH(D): O POS

## 2016-08-06 LAB — URINE MICROSCOPIC-ADD ON

## 2016-08-06 LAB — AMNISURE RUPTURE OF MEMBRANE (ROM) NOT AT ARMC: AMNISURE: NEGATIVE

## 2016-08-06 LAB — FETAL FIBRONECTIN: FETAL FIBRONECTIN: POSITIVE — AB

## 2016-08-06 MED ORDER — LIDOCAINE HCL 2 % EX GEL
1.0000 "application " | Freq: Two times a day (BID) | CUTANEOUS | Status: DC
  Filled 2016-08-06 (×2): qty 5

## 2016-08-06 MED ORDER — COMPLETENATE 29-1 MG PO CHEW
1.0000 | CHEWABLE_TABLET | Freq: Every day | ORAL | Status: DC
  Administered 2016-08-07 – 2016-08-13 (×7): 1 via ORAL
  Filled 2016-08-06 (×8): qty 1

## 2016-08-06 MED ORDER — SODIUM CHLORIDE 0.9 % IV SOLN
INTRAVENOUS | Status: DC
  Administered 2016-08-06 – 2016-08-08 (×4): via INTRAVENOUS
  Administered 2016-08-09: 100 mL/h via INTRAVENOUS
  Administered 2016-08-10 – 2016-08-11 (×2): via INTRAVENOUS

## 2016-08-06 MED ORDER — LIDOCAINE HCL 2 % EX GEL
1.0000 "application " | Freq: Once | CUTANEOUS | Status: AC
  Administered 2016-08-06: 1 via TOPICAL
  Filled 2016-08-06: qty 20

## 2016-08-06 MED ORDER — ACETAMINOPHEN 500 MG PO TABS: 1000.0000 mg | ORAL_TABLET | Freq: Four times a day (QID) | ORAL | Status: DC | PRN

## 2016-08-06 MED ORDER — SODIUM CHLORIDE 0.9 % IV BOLUS (SEPSIS)
1000.0000 mL | Freq: Once | INTRAVENOUS | Status: AC
  Administered 2016-08-06: 1000 mL via INTRAVENOUS

## 2016-08-06 MED ORDER — LIDOCAINE 2 % EX GEL: 1.0000 | Freq: Two times a day (BID) | CUTANEOUS | Status: DC

## 2016-08-06 MED ORDER — LIDOCAINE HCL 2 % EX GEL
1.0000 "application " | Freq: Two times a day (BID) | CUTANEOUS | Status: DC | PRN
  Administered 2016-08-07: 1 via TOPICAL
  Filled 2016-08-06: qty 5

## 2016-08-06 MED ORDER — NICOTINE 14 MG/24HR TD PT24
14.0000 mg | MEDICATED_PATCH | Freq: Every day | TRANSDERMAL | Status: DC
  Administered 2016-08-07 – 2016-08-13 (×5): 14 mg via TRANSDERMAL
  Filled 2016-08-06 (×10): qty 1

## 2016-08-06 MED ORDER — BENZOCAINE-MENTHOL 20-0.5 % EX AERO
1.0000 "application " | INHALATION_SPRAY | CUTANEOUS | Status: DC | PRN
  Administered 2016-08-06: 1 via TOPICAL
  Filled 2016-08-06: qty 56

## 2016-08-06 MED ORDER — VALACYCLOVIR HCL 500 MG PO TABS
1000.0000 mg | ORAL_TABLET | Freq: Two times a day (BID) | ORAL | Status: DC
  Administered 2016-08-06 – 2016-08-07 (×3): 1000 mg via ORAL
  Filled 2016-08-06 (×4): qty 2

## 2016-08-06 MED ORDER — DEXTROSE 5 % IV SOLN
1.0000 g | INTRAVENOUS | Status: DC
  Administered 2016-08-06 – 2016-08-07 (×2): 1 g via INTRAVENOUS
  Filled 2016-08-06 (×2): qty 10

## 2016-08-06 MED ORDER — COMPLETENATE 29-1 MG PO CHEW: 1.0000 | CHEWABLE_TABLET | Freq: Every day | ORAL | Status: DC

## 2016-08-06 MED ORDER — OXYCODONE HCL 5 MG PO TABS
5.0000 mg | ORAL_TABLET | Freq: Four times a day (QID) | ORAL | Status: DC | PRN
  Administered 2016-08-06 – 2016-08-09 (×8): 5 mg via ORAL
  Filled 2016-08-06 (×9): qty 1

## 2016-08-06 NOTE — H&P (Signed)
ANTEPARTUM ADMISSION HISTORY AND PHYSICAL NOTE  History of Present Illness: Laurence SlateLindsay E Uriegas is a 20 y.o. G1P0 with IUP at 638w0d by 9-wk U/S, and pregnancy complicated by h/o polysubstance abuse, UTI in pregnancy, and chronic hepatitis C, who [facility] with dysuria and back pain. Found to have suspected HSV outbreak and UTI and transferred here.   Patient reports 3-day history of dysuria, difficulty urinating and pelvic discomfort. She has also had leakage of fluid since then. Has had constant leakage, but no big gush. Denies vaginal bleeding. Since yesterday she has also had back pain and chills. Denies vaginal bleeding, abdominal cramping or contractions.   Patient denies past h/o HSV. No new partners. Last sexual intercourse 3 days ago.   Past Medical History:  Diagnosis Date  . Depression   . Hepatitis C   . Infection   . Scar of abdomen 03/2012  . Suicide attempt by drug ingestion (HCC) 03/2012    Past Surgical History:  Procedure Laterality Date  . APPENDECTOMY  age 66 yrs.  Marland Kitchen. HERNIA REPAIR    . SCAR REVISION  03/22/2012   Procedure: SCAR REVISION;  Surgeon: Wayland Denislaire Sanger, DO;  Location: [facility];  Service: Plastics;  Laterality: Right;  Excision painful abdominal appendix scar contracture    OB History  Gravida Para Term Preterm AB Living  1 0 0 0 0 0  SAB TAB Ectopic Multiple Live Births  0 0 0 0 0    # Outcome Date GA Lbr Len/2nd Weight Sex Delivery Anes PTL Lv  1 Current               Social History   Social History  . Marital status: Single    Spouse name: N/A  . Number of children: N/A  . Years of education: N/A   Social History Main Topics  . Smoking status: Current Every Day Smoker    Packs/day: 1.00    Types: Cigarettes  . Smokeless tobacco: Current User     Comment: outside smokers at home  . Alcohol use No  . Drug use: No  . Sexual activity: Yes   Other Topics Concern  . None   Social History Narrative   . None    History reviewed. No pertinent family history.  Allergies  Allergen Reactions  . Tylenol [Acetaminophen] Hives  . Vicodin [Hydrocodone-Acetaminophen]     hives  . Penicillins Hives    Prescriptions Prior to Admission  Medication Sig Dispense Refill Last Dose  . acetaminophen (TYLENOL) 325 MG tablet Take 325 mg by mouth every 6 (six) hours as needed for mild pain.   08/05/2016 at Unknown time  . Prenatal MV-Min-Fe Fum-FA-DHA (PRENATAL+DHA PO) Take 1 tablet by mouth daily.   08/06/2016 at Unknown time    Review of Systems  Positive for chills, back pain, dysuria, and vaginal discharge Negative for SOB, chest pain, nausea, vomiting, diarrhea   Vitals:  BP 110/62 (BP Location: Left Arm)   Pulse (!) 107   Temp 99.4 F (37.4 C) (Oral)   Resp 20   Ht 5\' 2"  (1.575 m)   Wt 142 lb (64.4 kg)   LMP 08/06/2016   SpO2 100%   BMI 25.97 kg/m  Physical Examination: CONSTITUTIONAL: Well-developed, well-nourished female in distress in pain  HENT:  Normocephalic, atraumatic. Normal oral mucosa EYES: Conjunctivae and EOM are normal. No scleral icterus.  NECK: Normal range of motion, supple, no masses SKIN: Skin is warm and dry.  NEUROLGIC: Alert and oriented to person, place, and time.  CARDIOVASCULAR: Normal heart rate noted, regular rhythm RESPIRATORY: Effort and breath sounds normal, no problems with respiration noted ABDOMEN: Soft, nondistended, gravid. + Suprapubic tenderness BACK: +CVA tenderness, R MUSCULOSKELETAL: Normal range of motion. No edema and no tenderness. 2+ distal pulses. Gu: bilateral labia significantly swollen, vesicular lesions noted; labia minora with open vesicles with drainage. Pt unable to tolerate spreading labia apart and speculum exam. Thin/serous brownish-yellow drainage on chux   Fetal Monitoring:Baseline: 140 bpm, mod variability, 10x10 acel, no decel Tocometer: Flat  Labs:  Results for orders placed or performed during the hospital encounter of  08/06/16 (from the past 24 hour(s))  Urinalysis, Routine w reflex microscopic (not at [facility])   Collection Time: 08/06/16 11:57 AM  Result Value Ref Range   Color, Urine ORANGE (A) YELLOW   APPearance CLOUDY (A) CLEAR   Specific Gravity, Urine 1.031 (H) 1.005 - 1.030   pH 6.0 5.0 - 8.0   Glucose, UA NEGATIVE NEGATIVE mg/dL   Hgb urine dipstick SMALL (A) NEGATIVE   Bilirubin Urine SMALL (A) NEGATIVE   Ketones, ur 15 (A) NEGATIVE mg/dL   Protein, ur 30 (A) NEGATIVE mg/dL   Nitrite POSITIVE (A) NEGATIVE   Leukocytes, UA MODERATE (A) NEGATIVE  Urine microscopic-add on   Collection Time: 08/06/16 11:57 AM  Result Value Ref Range   Squamous Epithelial / LPF 0-5 (A) NONE SEEN   WBC, UA 6-30 0 - 5 WBC/hpf   RBC / HPF 6-30 0 - 5 RBC/hpf   Bacteria, UA FEW (A) NONE SEEN   Urine-Other MUCOUS PRESENT   Wet prep, genital   Collection Time: 08/06/16  1:38 PM  Result Value Ref Range   Yeast Wet Prep HPF POC NONE SEEN NONE SEEN   Trich, Wet Prep NONE SEEN NONE SEEN   Clue Cells Wet Prep HPF POC NONE SEEN NONE SEEN   WBC, Wet Prep HPF POC MODERATE (A) NONE SEEN   Sperm NONE SEEN   Amnisure rupture of membrane (rom)not at [facility]   Collection Time: 08/06/16  1:38 PM  Result Value Ref Range   Amnisure ROM NEGATIVE   Fetal fibronectin   Collection Time: 08/06/16  1:38 PM  Result Value Ref Range   Fetal Fibronectin POSITIVE (A) NEGATIVE  Rapid HIV screen (HIV 1/2 Ab+Ag)   Collection Time: 08/06/16  2:03 PM  Result Value Ref Range   HIV-1 P24 Antigen - HIV24 NON REACTIVE NON REACTIVE   HIV 1/2 Antibodies NON REACTIVE NON REACTIVE   Interpretation (HIV Ag Ab)      A non reactive test result means that HIV 1 or HIV 2 antibodies and HIV 1 p24 antigen were not detected in the specimen.  Urine rapid drug screen (hosp performed)   Collection Time: 08/06/16  3:05 PM  Result Value Ref Range   Opiates POSITIVE (A) NONE DETECTED   Cocaine NONE DETECTED NONE DETECTED   Benzodiazepines POSITIVE (A) NONE  DETECTED   Amphetamines POSITIVE (A) NONE DETECTED   Tetrahydrocannabinol POSITIVE (A) NONE DETECTED   Barbiturates NONE DETECTED NONE DETECTED     Assessment and Plan: 20 y.o. 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 Pt with LOF, but membranes intact with U/S and negative Amnisure. +FFN. UDS +opiates, benzodiazepines, amphetamines, and THC. HIV negative  - Admit to Antenatal - Treat empirically for HSV: Valacyclovir 1  gram q  12h - Treat pyelonephritis: UA c/w UTI and pt with CVA tenderness - Rocephin 1g q24h - HSV PCR swab collected, HSV titers - GC/Chalymia probe sent - Follow up on urine culture - Close monitoring   Frederik Pear, MD  Patient seen and evaluated with attending physician, Dr. Vergie Living.

## 2016-08-06 NOTE — MAU Note (Signed)
Started out burning really bad when she pees, started 3 days ago.  Started leaking fluid 2 days ago, is clear..  Woke up this morning with sharp pain in shoulder blades and low back

## 2016-08-06 NOTE — ED Notes (Signed)
MD Mackuen at the bedside 

## 2016-08-06 NOTE — ED Triage Notes (Signed)
Per Pt, Pt is coming from home with complaints of a clear slightly browned fluid that started leaking two days ago. Today, pt started to have spotting and abdominal pain. Pt reports being six months pregnant. Reports having extreme burning with urination.

## 2016-08-06 NOTE — ED Notes (Signed)
Odessa FlemingErin OB RN to give report

## 2016-08-06 NOTE — ED Notes (Signed)
OB RN Erin at the bedside

## 2016-08-06 NOTE — ED Provider Notes (Addendum)
[facility]-EMERGENCY DEPT Provider Note   CSN: [id number] Arrival date & time: 08/06/16  [id number]     History   Chief Complaint Chief Complaint  Patient presents with  . Vaginal Discharge    HPI Lisa Schmitt is a 20 y.o. female.  HPI   Patient is a 20 year old female who is [redacted] weeks pregnant. She is presenting today with Feels she's leaking fluid for last 3 days. Additionally she has a lot of pain with urination. This started around 3 days ago. [facility], missed most recent appointmetn.  G1P0    Past Medical History:  Diagnosis Date  . Depression   . Scar of abdomen 03/2012  . Suicide attempt by drug ingestion (HCC) 03/2012    Patient Active Problem List   Diagnosis Date Noted  . Scar of abdominal wall 03/22/2012    Past Surgical History:  Procedure Laterality Date  . APPENDECTOMY  age 58 yrs.  Marland Kitchen. SCAR REVISION  03/22/2012   Procedure: SCAR REVISION;  Surgeon: Wayland Denislaire Sanger, DO;  Location: [facility];  Service: Plastics;  Laterality: Right;  Excision painful abdominal appendix scar contracture    OB History    Gravida Para Term Preterm AB Living   1             SAB TAB Ectopic Multiple Live Births                   Home Medications    Prior to Admission medications   Medication Sig Start Date End Date Taking? Authorizing Provider  acetaminophen (TYLENOL) 325 MG tablet Take 325 mg by mouth every 6 (six) hours as needed for mild pain.   Yes Historical Provider, MD  Prenatal MV-Min-Fe Fum-FA-DHA (PRENATAL+DHA PO) Take 1 tablet by mouth daily.   Yes Historical Provider, MD    Family History No family history on file.  Social History Social History  Substance Use Topics  . Smoking status: Current Every Day Smoker    Packs/day: 1.00    Types: Cigarettes  . Smokeless tobacco: Never Used     Comment: outside smokers at home  . Alcohol use No     Allergies   Vicodin [hydrocodone-acetaminophen] and Penicillins   Review of  Systems Review of Systems  Constitutional: Negative for activity change.  Respiratory: Negative for shortness of breath.   Cardiovascular: Negative for chest pain.  Gastrointestinal: Negative for abdominal pain.  Genitourinary: Positive for difficulty urinating, dyspareunia, dysuria, genital sores and vaginal pain.  All other systems reviewed and are negative.    Physical Exam Updated Vital Signs BP 111/77   Pulse 106   Temp 98 F (36.7 C) (Oral)   Resp 18   Ht 5\' 2"  (1.575 m)   Wt 142 lb (64.4 kg)   LMP 08/06/2016   SpO2 100%   BMI 25.97 kg/m   Physical Exam  Constitutional: She is oriented to person, place, and time. She appears well-developed and well-nourished.  HENT:  Head: Normocephalic and atraumatic.  Eyes: Right eye exhibits no discharge.  Cardiovascular: Normal rate.   Pulmonary/Chest: Effort normal. No respiratory distress.  Abdominal:  Gravid uterus. I  Genitourinary: No vaginal discharge found.  Genitourinary Comments: Multiple herpetic lesions, grossly swollen, tnder to touch.   Neurological: She is oriented to person, place, and time.  Skin: Skin is warm and dry. She is not diaphoretic.  Psychiatric: She has a normal mood and affect.  Nursing note and vitals reviewed.  ED Treatments / Results  Labs (all labs ordered are listed, but only abnormal results are displayed) Labs Reviewed  POCT FERN TEST    EKG  EKG Interpretation None       Radiology No results found.  Procedures Procedures (including critical care time)  Medications Ordered in ED Medications - No data to display   Initial Impression / Assessment and Plan / ED Course  I have reviewed the triage vital signs and the nursing notes.  Pertinent labs & imaging results that were available during my care of the patient were reviewed by me and considered in my medical decision making (see chart for details).  Clinical Course    Patient is a 20 year old female who is [redacted]  weeks pregnant. She is presenting with feeling that she is leaking fluid for the last 2 days as well as burning with urination. Patient appears to have herpetic lesions. Discussed with OB/GYN on call. They will check for [facility]  Concern for rupture of membranes and new HSV. Patient having pain, concern for contractions. We'll defer labs to be [facility]  Dr. Koleen Distance , OB/GYN wanted Foley placement and transferred MAU. She has a lot of pain with urination. We used a lido-jjet and then placed Foley.  HR 144 of baby  Final Clinical Impressions(s) / ED Diagnoses   Final diagnoses:  None    New Prescriptions New Prescriptions   No medications on file     Colette Dicamillo Randall An, MD 08/06/16 1154    Storey Stangeland Lyn Derita Michelsen, MD 08/06/16 1155

## 2016-08-06 NOTE — ED Notes (Signed)
MD and Ladona Ridgelaylor at the bedside placing foley

## 2016-08-06 NOTE — Progress Notes (Addendum)
See H&P for details  Bedside u/s: breech (complete), cervix appears long and closed, AFI 17.3, normal FHR, +FM EGBUS: swollen b/l with no inguinal LAD or tenderness in the inguinal folds. B/l labia swollen with healing brownish with white border plaques (similar to what would heal after a burn) seen b/l in the inner thighs (no eyrthema, nttp). Unable to tolerate spreading labia well at all and amnisure, FFN, wet prep, GC/CT, wet prep obtained with brownish/yellowish fluid on q tips.  Urine with slightly concentrated urine. Pt endorses right and upper and lower back pain and states it hurts all over and feels like a UTI; pt texting and seems comfortable on my entry into the room. Will admit for pain control and HSV evaluation (swab already sent) and do IgG and M; pt denies these symptoms in the past and no h/o HSV. No new fluid on chux with brownish fluid seen similar to q tips. Unlikely pprom but will continue to observe.  Lisa Schmitt, Jr MD Attending Center for [facility]ulty Practice)

## 2016-08-06 NOTE — Progress Notes (Addendum)
[facility] reviewed and +nitrite. Will start rocephin and treat as pyelo patient with possible HSV outbreak.  UDS: +opiates, benzos, amph, thc WP with WBC and moderate bacteria seen  Can wait for GC/CT cultures (pt already getting rocephin) to see if need to add on azithro. Will order admit labs too. Given negative amnisure, u/s and patient presentation, negative EFM, unlikely OB related issues such as PPROM or PTL and will continue to assess.   Lisa Schmitt, Jr MD Attending Center for [facility])

## 2016-08-06 NOTE — ED Notes (Signed)
Pt's belongings given to grandfather. Pt will only travel with phone

## 2016-08-06 NOTE — ED Notes (Signed)
Spoke with [facility]. Pt is to have Fetal Heart Tones, Bedside US, and Active Transfer to MAU.

## 2016-08-07 DIAGNOSIS — N12 Tubulo-interstitial nephritis, not specified as acute or chronic: Secondary | ICD-10-CM | POA: Diagnosis present

## 2016-08-07 LAB — URINE CULTURE: CULTURE: NO GROWTH

## 2016-08-07 LAB — HEPATITIS C ANTIBODY

## 2016-08-07 LAB — GC/CHLAMYDIA PROBE AMP (~~LOC~~) NOT AT ARMC
Chlamydia: NEGATIVE
Neisseria Gonorrhea: NEGATIVE

## 2016-08-07 MED ORDER — OXYCODONE-ACETAMINOPHEN 5-325 MG PO TABS
2.0000 | ORAL_TABLET | ORAL | Status: DC | PRN
  Administered 2016-08-07 – 2016-08-09 (×10): 2 via ORAL
  Filled 2016-08-07 (×10): qty 2

## 2016-08-07 MED ORDER — LIDOCAINE 5 % EX OINT
TOPICAL_OINTMENT | Freq: Four times a day (QID) | CUTANEOUS | Status: DC | PRN
  Administered 2016-08-07: 22:00:00 via TOPICAL
  Filled 2016-08-07: qty 35.44

## 2016-08-07 MED ORDER — GRX ANALGESIC BALM EX OINT
1.0000 "application " | TOPICAL_OINTMENT | Freq: Four times a day (QID) | CUTANEOUS | Status: DC
  Administered 2016-08-07 (×2): 1 via CUTANEOUS
  Filled 2016-08-07: qty 28

## 2016-08-07 MED ORDER — ACYCLOVIR SODIUM 50 MG/ML IV SOLN
5.0000 mg/kg | Freq: Three times a day (TID) | INTRAVENOUS | Status: DC
  Administered 2016-08-07 – 2016-08-09 (×5): 320 mg via INTRAVENOUS
  Filled 2016-08-07 (×7): qty 6.4

## 2016-08-07 NOTE — Progress Notes (Signed)
Labia extremely swollen. Pt offered ice pack but refused. Lidocaine jelly applied. Carmelina DaneERRI L Juliahna Wiswell, RN

## 2016-08-07 NOTE — Progress Notes (Signed)
Pt complaining of extremely swollen labia. Ice pack given to patient for relief. Carmelina DaneERRI L Delmar Arriaga, RN

## 2016-08-08 DIAGNOSIS — A6009 Herpesviral infection of other urogenital tract: Secondary | ICD-10-CM

## 2016-08-08 DIAGNOSIS — O2303 Infections of kidney in pregnancy, third trimester: Secondary | ICD-10-CM

## 2016-08-08 DIAGNOSIS — Z3A28 28 weeks gestation of pregnancy: Secondary | ICD-10-CM

## 2016-08-08 DIAGNOSIS — O98313 Other infections with a predominantly sexual mode of transmission complicating pregnancy, third trimester: Principal | ICD-10-CM

## 2016-08-08 LAB — HSV(HERPES SMPLX)ABS-I+II(IGG+IGM)-BLD
HSV 1 Glycoprotein G Ab, IgG: <0.91 index (ref 0.00–0.90)
HSVI/II Comb IgM: 1.01 Ratio — ABNORMAL HIGH (ref 0.00–0.90)

## 2016-08-08 LAB — CULTURE, OB URINE: Culture: NO GROWTH

## 2016-08-08 LAB — HERPES SIMPLEX VIRUS(HSV) DNA BY PCR
HSV 1 DNA: NEGATIVE
HSV 2 DNA: POSITIVE — AB

## 2016-08-08 MED ORDER — HYDROMORPHONE HCL 2 MG PO TABS
1.0000 mg | ORAL_TABLET | Freq: Once | ORAL | Status: AC
  Administered 2016-08-08: 1 mg via ORAL
  Filled 2016-08-08: qty 1

## 2016-08-08 MED ORDER — DEXTROSE 5 % IV SOLN
2.0000 g | INTRAVENOUS | Status: DC
  Administered 2016-08-08: 2 g via INTRAVENOUS
  Filled 2016-08-08: qty 2

## 2016-08-08 NOTE — Progress Notes (Signed)
Patient ID: Lisa Schmitt, female   DOB: 06/06/1996, 20 y.o.   MRN: [id number]  FACULTY PRACTICE ANTEPARTUM(COMPREHENSIVE) NOTE  Lisa Schmitt is a 20 y.o. G1P0000 at 689w2d  who is admitted for pyelonephritis and primary herpes outbreak .   Length of Stay:  2  Days  Subjective: Pt c/o labial pain and swelling.   Patient reports the fetal movement as active. Patient reports uterine contraction  activity as none. Patient reports  vaginal bleeding as none. Patient describes fluid per vagina as None.  Vitals:  Blood pressure (!) 101/59, pulse 90, temperature 97.6 F (36.4 C), temperature source Oral, resp. rate 16, height 5\' 2"  (1.575 m), weight 142 lb (64.4 kg), last menstrual period 08/06/2016, SpO2 100 %. Physical Examination:  General appearance - alert, well appearing, and in no distress; pt is supine with buttocks in most dependent position Abdomen - gravid, non tender Back - right CVA tenderness Labia - left labia more swollen than the right but improved from last night.  It is soft and edematous.  No fluctuant areas.  No induration.  Vesicles present.  No evidence of superimposed infection,.  Fetal Monitoring:  Baseline: 130 bpm, Variability: Good {> 6 bpm), Accelerations: Reactive and Decelerations: Absent   Medications:  Scheduled . acyclovir  5 mg/kg Intravenous Q8H  . cefTRIAXone (ROCEPHIN)  IV  1 g Intravenous Q24H  . nicotine  14 mg Transdermal Daily  . prenatal vitamin w/FE, FA  1 tablet Oral Q1200   I have reviewed the patient's current medications.  ASSESSMENT: Patient Active Problem List   Diagnosis Date Noted  . Pyelonephritis 08/07/2016  . HSV-2 infection complicating pregnancy 08/06/2016  . Pyelonephritis affecting pregnancy 08/06/2016  . Scar of abdominal wall 03/22/2012    PLAN:  1-HSV--continue IV acyclovir until clinical improvement then change to valtrex.  Continue lidocaine and percocet for pain management.  Pt encouraged to stand and ambulate to  help with labial swelling  2-Pyelonephritis--continue antibiotics until afebrile and asymptomatic.    3-[facility] and review records.    Terrance Lanahan H. 08/08/2016,3:46 AM

## 2016-08-09 DIAGNOSIS — O99322 Drug use complicating pregnancy, second trimester: Secondary | ICD-10-CM | POA: Diagnosis present

## 2016-08-09 LAB — COMPREHENSIVE METABOLIC PANEL WITH GFR
ALT: 22 U/L (ref 14–54)
AST: 16 U/L (ref 15–41)
Albumin: 2.2 g/dL — ABNORMAL LOW (ref 3.5–5.0)
Alkaline Phosphatase: 56 U/L (ref 38–126)
Anion gap: 5 (ref 5–15)
BUN: <5 mg/dL — ABNORMAL LOW (ref 6–20)
CO2: 25 mmol/L (ref 22–32)
Calcium: 7.9 mg/dL — ABNORMAL LOW (ref 8.9–10.3)
Chloride: 106 mmol/L (ref 101–111)
Creatinine, Ser: 0.44 mg/dL (ref 0.44–1.00)
GFR calc Af Amer: >60 mL/min
GFR calc non Af Amer: >60 mL/min
Glucose, Bld: 101 mg/dL — ABNORMAL HIGH (ref 65–99)
Potassium: 3.2 mmol/L — ABNORMAL LOW (ref 3.5–5.1)
Sodium: 136 mmol/L (ref 135–145)
Total Bilirubin: 0.2 mg/dL — ABNORMAL LOW (ref 0.3–1.2)
Total Protein: 5.3 g/dL — ABNORMAL LOW (ref 6.5–8.1)

## 2016-08-09 LAB — TYPE AND SCREEN
ABO/RH(D): O POS
Antibody Screen: NEGATIVE

## 2016-08-09 LAB — CBC WITH DIFFERENTIAL/PLATELET
BASOS ABS: 0 10*3/uL (ref 0.0–0.1)
Basophils Relative: 1 %
EOS PCT: 1 %
Eosinophils Absolute: 0.1 10*3/uL (ref 0.0–0.7)
HCT: 29.8 % — ABNORMAL LOW (ref 36.0–46.0)
Hemoglobin: 10 g/dL — ABNORMAL LOW (ref 12.0–15.0)
LYMPHS PCT: 29 %
Lymphs Abs: 1.6 10*3/uL (ref 0.7–4.0)
MCH: 28.9 pg (ref 26.0–34.0)
MCHC: 33.6 g/dL (ref 30.0–36.0)
MCV: 86.1 fL (ref 78.0–100.0)
MONO ABS: 0.4 10*3/uL (ref 0.1–1.0)
MONOS PCT: 6 %
Neutro Abs: 3.6 10*3/uL (ref 1.7–7.7)
Neutrophils Relative %: 63 %
PLATELETS: 176 10*3/uL (ref 150–400)
RBC: 3.46 MIL/uL — ABNORMAL LOW (ref 3.87–5.11)
RDW: 13 % (ref 11.5–15.5)
WBC: 5.7 10*3/uL (ref 4.0–10.5)

## 2016-08-09 MED ORDER — POTASSIUM CHLORIDE CRYS ER 20 MEQ PO TBCR
30.0000 meq | EXTENDED_RELEASE_TABLET | Freq: Two times a day (BID) | ORAL | Status: AC
  Administered 2016-08-09 – 2016-08-12 (×6): 30 meq via ORAL
  Filled 2016-08-09 (×7): qty 1

## 2016-08-09 MED ORDER — OXYCODONE HCL 5 MG PO TABS
5.0000 mg | ORAL_TABLET | ORAL | Status: DC | PRN
  Administered 2016-08-09 – 2016-08-14 (×25): 10 mg via ORAL
  Filled 2016-08-09 (×25): qty 2

## 2016-08-09 MED ORDER — DEXTROSE 5 % IV SOLN
10.0000 mg/kg | Freq: Three times a day (TID) | INTRAVENOUS | Status: DC
  Filled 2016-08-09 (×2): qty 12.9

## 2016-08-09 MED ORDER — ACYCLOVIR SODIUM 50 MG/ML IV SOLN
650.0000 mg | Freq: Three times a day (TID) | INTRAVENOUS | Status: DC
  Administered 2016-08-09 – 2016-08-14 (×15): 650 mg via INTRAVENOUS
  Filled 2016-08-09 (×18): qty 13

## 2016-08-09 MED ORDER — ACETAMINOPHEN 325 MG PO TABS
650.0000 mg | ORAL_TABLET | ORAL | Status: DC | PRN
  Administered 2016-08-12: 650 mg via ORAL
  Filled 2016-08-09 (×2): qty 2

## 2016-08-09 NOTE — Progress Notes (Signed)
Per MD order, vital signs not obtained at this time. Milon DikesKristina D Fedora Knisely, RN

## 2016-08-09 NOTE — Progress Notes (Signed)
Patient sleeping and doesn't desire to be disturbed. AF VS normal and stable and per RN, pt doing well. RN to call me when patient is awake. If improving symptomatically, then can transition to PO acyclovir.   Cornelia Copaharlie Laray Rivkin, Jr MD Attending Center for [facility]ulty Practice)

## 2016-08-09 NOTE — Progress Notes (Signed)
Daily Antepartum Note  Admission Date: 08/06/2016 Current Date: 08/09/2016 1:40 PM  Lisa Schmitt is a 20 y.o. G1 @ [redacted]w[redacted]d, HD#4, admitted for vulvar pain and possible HSV infection and possible pyelo.  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  Pregnancy complicated by: Patient Active Problem List   Diagnosis Date Noted  . Pyelonephritis 08/07/2016  . 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 Pyelonephritis affecting pregnancy 08/06/2016  . Supervision of high risk pregnancy, antepartum 04/24/2016  . Chronic hepatitis C affecting pregnancy, antepartum (HCC) 03/27/2016  . History of breast abscess 03/27/2016  . History of depression 03/27/2016  . Nausea and vomiting during pregnancy 03/27/2016  . Scar of abdominal wall 03/22/2012   and poly substance abuse  Overnight/24hr events:  Patient now awake and states she feels more pain then when I admitted her  Subjective:  No fevers, chills, s/s of decreased fetal movement of preterm labor.   Objective:    Current Vital Signs 24h Vital Sign Ranges  T 97.9 F (36.6 C) Temp  Avg: 97.9 F (36.6 C)  Min: 97.5 F (36.4 C)  Max: 98.2 F (36.8 C)  BP (!) 116/59 BP  Min: 103/60  Max: 120/74  HR 75 Pulse  Avg: 85.7  Min: 75  Max: 93  RR 18 Resp  Avg: 17  Min: 16  Max: 18  SaO2 100 % Not Delivered No Data Recorded       24 Hour I/O Current Shift I/O  Time Ins Outs 09/08 0701 - 09/09 0700 In: 425.6  Out: 3300 [Urine:3300] No intake/output data recorded.   Physical exam: General: Well nourished, well developed female in no acute distress. GU: foley still in place and significant vulvar edema and appear left > right. It is significantly more edematous thane on admission. No inguinal LAD and doesn't appear to involve the rectal area. Right vulvar edema appears to go from the anterior to posterior fourchette and is about 3cm in width and on the left it's the same with the width approx 4-5cm. Palpation not done.   Abdomen: gravid nttp Extremities: no clubbing, cyanosis or edema Skin: Warm and dry.   Medications: Current Facility-Administered Medications  Medication Dose Route Frequency Provider Last Rate Last Dose  . 0.9 %  sodium chloride infusion   Intravenous Continuous [place] Bing, MD 100 mL/hr at 08/09/16 1022 100 mL/hr at 08/09/16 1022  . acyclovir (ZOVIRAX) 645 mg in dextrose 5 % 100 mL IVPB  10 mg/kg Intravenous Q8H [place] Bing, MD      . lidocaine (XYLOCAINE) 5 % ointment   Topical QID PRN Lesly Dukes, MD      . nicotine (NICODERM CQ - dosed in mg/24 hours) patch 14 mg  14 mg Transdermal Daily Lazaro Arms, MD   14 mg at 08/08/16 1013  . oxyCODONE (Oxy IR/ROXICODONE) immediate release tablet 5 mg  5 mg Oral Q6H PRN [place] Bing, MD   5 mg at 08/09/16 0536  . oxyCODONE-acetaminophen (PERCOCET/ROXICET) 5-325 MG per tablet 2 tablet  2 tablet Oral Q4H PRN Lesly Dukes, MD   2 tablet at 08/09/16 1325  . prenatal vitamin w/FE, FA (NATACHEW) chewable tablet 1 tablet  1 tablet Oral Q1200 [place] Bing, MD   1 tablet at 08/08/16 1216    Labs:   Recent Labs Lab 08/06/16 1643  WBC 9.5  HGB 9.4*  HCT 27.5*  PLT 181     Recent Labs Lab 08/06/16 1403  NA  135  K 3.2*  CL 106  CO2 24  BUN <5*  CREATININE 0.42*  CALCIUM 7.9*  PROT 5.3*  BILITOT 0.2*  ALKPHOS 62  ALT 42  AST 29  GLUCOSE 83    Radiology: none  Assessment & Plan:  Pt with worsening HSV 1/2 primary infection *IUP: pt amenable to EFM today *ID: serologies with negative HSV 1/2 IgG but + HSV 1/2 IgM. D/w ID and recommends higher dose IV acyclovir and since no e/o cellulitis, can avoid vanc for MRSA coverage. Will continue to monitor and see if higher dose IV acyclovir helps with s/s and f/u CBC from today. May need drainage if not improving. I don't believe that she needs imaging as edema doesn't appear to track anywhere and appears limited to just b/l vulvar area.  *FEN/GI: regular diet, saline  lock IV *Analgesia: PO prns and consider lyrica or gabapentin to help with pain, given HSV outbreak *PPx: SCDs  Lisa Schmitt, Jr. MD Attending Center for [facility]ulty Practice)

## 2016-08-09 NOTE — Progress Notes (Signed)
Patient refused fetal monitoring for tonight's shift assessment. Will continue to monitor. Milon DikesKristina D Chanetta Moosman, RN

## 2016-08-09 NOTE — Progress Notes (Signed)
Patient refused fetal monitoring for this PM. Patient did let RN get 1 minute of information regarding fetal heart rate.  Will continue to monitor. Milon DikesKristina D Nyleah Mcginnis, RN

## 2016-08-10 MED ORDER — POLYETHYLENE GLYCOL 3350 17 G PO PACK
17.0000 g | PACK | Freq: Every day | ORAL | Status: DC
  Administered 2016-08-11 – 2016-08-13 (×2): 17 g via ORAL
  Filled 2016-08-10 (×4): qty 1

## 2016-08-10 NOTE — Progress Notes (Signed)
Patient resting. Support person states patient had better day and night yesterday and would like something for constipation. Will come back when patient is awake and add qday miralax.  Lisa Schmitt, Jr MD Attending Center for [facility])

## 2016-08-11 MED ORDER — ZOLPIDEM TARTRATE 5 MG PO TABS
5.0000 mg | ORAL_TABLET | Freq: Every evening | ORAL | Status: DC | PRN
  Administered 2016-08-11 – 2016-08-14 (×3): 5 mg via ORAL
  Filled 2016-08-11 (×3): qty 1

## 2016-08-11 MED ORDER — CALCIUM CARBONATE ANTACID 500 MG PO CHEW
400.0000 mg | CHEWABLE_TABLET | Freq: Three times a day (TID) | ORAL | Status: DC | PRN
  Administered 2016-08-11 – 2016-08-14 (×3): 400 mg via ORAL
  Filled 2016-08-11 (×3): qty 2

## 2016-08-11 NOTE — Plan of Care (Signed)
Problem: Bowel/Gastric: Goal: Will not experience complications related to bowel motility Outcome: Progressing Pt states no BM in 5 days, encouraged to take Miralax as ordered considering she's been refusing med

## 2016-08-11 NOTE — Progress Notes (Signed)
Patient ID: Lisa SlateLindsay E Schmitt, female   DOB: 10/31/1996, 20 y.o.   MRN: [id number] FACULTY PRACTICE ANTEPARTUM(COMPREHENSIVE) NOTE  Lisa Schmitt is a 20 y.o. G1P0000 at 2618w5d  who is admitted for vulvar pain, primary HSV II infection, and pyelo has been ruled out by negative culture.   Fetal presentation is unsure. Length of Stay:  5  Days  Subjective: Patient feels the vulva is more firm, seen yesterday and is unchanged by my exam Patient reports the fetal movement as active. Patient reports uterine contraction  activity as none. Patient reports  vaginal bleeding as none. Patient describes fluid per vagina as None.  Vitals:  Blood pressure 104/66, pulse 77, temperature 98.3 F (36.8 C), temperature source Oral, resp. rate 18, height 5\' 2"  (1.575 m), weight 142 lb (64.4 kg), last menstrual period 08/06/2016, SpO2 100 %. Physical Examination:  General appearance - oriented to person, place, and time, normal appearing weight and ill-appearing Heart - normal rate and regular rhythm Abdomen - soft, nontender, nondistended, gravid nontender uterus, shotty bilateral adenopathy Vulva bilateral severe edema along entire labia majora bilaterally , foley in bladder, 2 tiny crusting skin vesicle scars on mid left labia majora. Palpation of the labia shows no focal mass, or fluctuance, there is more tenderness bilaterally at posterior fourchette, no assymmetry, no identifiable suggestion of bartholins Fundal Height:  size equals dates Cervical Exam: Not evaluated. and found to be not evaluated/ { Extremities: extremities normal, atraumatic, no cyanosis or edema and Homans sign is negative, no sign of DVT with DTRs 2+ bilaterally Membranes:intact  Fetal Monitoring:  fhr q shift  Labs:  No results found for this or any previous visit (from the past 24 hour(s)).  Imaging Studies:     Currently EPIC will not allow sonographic studies to automatically populate into notes.  In the meantime, copy and  paste results into note or free text.  Medications:  Scheduled . acyclovir  650 mg Intravenous Q8H  . nicotine  14 mg Transdermal Daily  . polyethylene glycol  17 g Oral Daily  . potassium chloride  30 mEq Oral BID WC  . prenatal vitamin w/FE, FA  1 tablet Oral Q1200   I have reviewed the patient's current medications.  ASSESSMENT: Patient Active Problem List   Diagnosis Date Noted  . 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 Pyelonephritis 08/07/2016  . 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 Pyelonephritis affecting pregnancy 08/06/2016  . Supervision of high risk pregnancy, antepartum 04/24/2016  . Chronic hepatitis C affecting pregnancy, antepartum (HCC) 03/27/2016  . History of breast abscess 03/27/2016  . History of depression 03/27/2016  . Nausea and vomiting during pregnancy 03/27/2016  . Scar of abdominal wall 03/22/2012    PLAN:  Continue IV acyclovir at higher dosing,  Patient and especially pt mother are concerned over lack of progress , but I do not see asymetry or local heat to suggest abscess , so no further diagnostic testing planned at this time. Kamren Heintzelman V 08/11/2016,7:04 AM

## 2016-08-12 DIAGNOSIS — O98513 Other viral diseases complicating pregnancy, third trimester: Secondary | ICD-10-CM

## 2016-08-12 DIAGNOSIS — B009 Herpesviral infection, unspecified: Secondary | ICD-10-CM

## 2016-08-12 LAB — TYPE AND SCREEN
ABO/RH(D): O POS
Antibody Screen: NEGATIVE

## 2016-08-12 NOTE — Progress Notes (Signed)
Patient ID: Lisa SlateLindsay E Schmitt, female   DOB: 09/14/1996, 20 y.o.   MRN: [id number] FACULTY PRACTICE ANTEPARTUM(COMPREHENSIVE) NOTE  Lisa Schmitt is a 20 y.o. G1P0000 at 6733w5d  who is admitted for vulvar pain, primary HSV II infection, and pyelo has been ruled out by negative culture.   Fetal presentation is unsure. Length of Stay:  6  Days  Subjective: Patient continues to complain of vulvar pain and swelling, worse when up and moving around.  Patient reports the fetal movement as active. Patient reports uterine contraction  activity as none. Patient reports  vaginal bleeding as none. Patient describes fluid per vagina as None.  Vitals:  Blood pressure 110/64, pulse 77, temperature 98.2 F (36.8 C), temperature source Oral, resp. rate 18, height 5\' 2"  (1.575 m), weight 142 lb (64.4 kg), last menstrual period 08/06/2016, SpO2 100 %. Physical Examination:  General appearance - oriented to person, place, and time, normal appearing weight and ill-appearing Heart: regular rate, no murmur Lungs: clear to auscultation bilaterally, no wheezing.  Abdomen - soft, nontender, nondistended, gravid nontender uterus, shotty bilateral adenopathy Vulva bilateral severe edema along entire labia majora bilaterally , foley in bladder, 2 tiny crusting skin vesicle scars on mid left labia majora. Palpation of the labia shows no focal mass, or fluctuance, there is more tenderness bilaterally at posterior fourchette, no assymmetry, no identifiable suggestion of bartholins Fundal Height:  size equals dates Cervical Exam: Not evaluated. and found to be not evaluated/ { Extremities: extremities normal, atraumatic, no cyanosis or edema and Homans sign is negative, no sign of DVT with DTRs 2+ bilaterally Membranes:intact  Fetal Monitoring:  fhr q shift  Labs:  No results found for this or any previous visit (from the past 24 hour(s)).  Imaging Studies:     Currently EPIC will not allow sonographic studies to  automatically populate into notes.  In the meantime, copy and paste results into note or free text.  Medications:  Scheduled . acyclovir  650 mg Intravenous Q8H  . nicotine  14 mg Transdermal Daily  . polyethylene glycol  17 g Oral Daily  . potassium chloride  30 mEq Oral BID WC  . prenatal vitamin w/FE, FA  1 tablet Oral Q1200   I have reviewed the patient's current medications.  ASSESSMENT: Patient Active Problem List   Diagnosis Date Noted  . 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 Pyelonephritis 08/07/2016  . 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 Pyelonephritis affecting pregnancy 08/06/2016  . Supervision of high risk pregnancy, antepartum 04/24/2016  . Chronic hepatitis C affecting pregnancy, antepartum (HCC) 03/27/2016  . History of breast abscess 03/27/2016  . History of depression 03/27/2016  . Nausea and vomiting during pregnancy 03/27/2016  . Scar of abdominal wall 03/22/2012    PLAN:  1.  Primary HSV  Minimal improvement seen.  Continues to have significant edema - no evidence of superimposed infection.    No need for MRI at this point  Continue Acyclovir  ? Formal ID consult.  Foley catheter in place - will need to be cautious to avoid infection  Sitz baths   Lasharn Bufkin JEHIEL 08/12/2016,7:45 AM

## 2016-08-12 NOTE — Progress Notes (Signed)
Pt refused lab draw at this time

## 2016-08-13 LAB — URINE DRUGS OF ABUSE SCREEN W ALC, ROUTINE (REF LAB)
BARBITURATE, UR: NEGATIVE ng/mL
COCAINE (METAB.): NEGATIVE ng/mL
Ethanol U, Quan: NEGATIVE %
Methadone Screen, Urine: NEGATIVE ng/mL
Phencyclidine, Ur: NEGATIVE ng/mL
Propoxyphene, Urine: NEGATIVE ng/mL

## 2016-08-13 LAB — AMPHETAMINE CONF, UR
AMPHETAMINE: NEGATIVE
Amphetamines: POSITIVE — AB
METHAMPHETAMINE QUANT UR: 1822 ng/mL
METHAMPHETAMINE, UR: POSITIVE — AB

## 2016-08-13 LAB — PANEL 799049: Cannabinoid GC/MS, Ur: POSITIVE — AB

## 2016-08-13 LAB — DRUG PROFILE 799031
BENZODIAZEPINES: POSITIVE — AB
HYDROXYALPRAZOLAM: POSITIVE — AB
NORDIAZEPAM: NEGATIVE
OH-Alprazolam GC/MS Conf: 449 ng/mL
OXAZEPAM UR: NEGATIVE

## 2016-08-13 LAB — OPIATES CONFIRMATION, URINE
CODEINE: NEGATIVE
MORPHINE GC/MS CONF: 766 ng/mL
MORPHINE: POSITIVE — AB
OPIATES: POSITIVE — AB

## 2016-08-13 MED ORDER — SODIUM CHLORIDE 0.9% FLUSH
3.0000 mL | INTRAVENOUS | Status: DC | PRN
  Administered 2016-08-13 (×2): 3 mL via INTRAVENOUS
  Filled 2016-08-13 (×2): qty 3

## 2016-08-13 NOTE — Progress Notes (Signed)
Pt c/o uterine ctxs, EFM applied.

## 2016-08-13 NOTE — Progress Notes (Signed)
Patient ID: Lisa Schmitt, female   DOB: 01/27/1996, 20 y.o.   MRN: [id number] FACULTY PRACTICE ANTEPARTUM(COMPREHENSIVE) NOTE  Lisa SlateLindsay E Schmitt is a 20 y.o. G1P0000 at 7639w0d by best clinical estimate who is admitted for primary HSV outbreak.   Fetal presentation is unsure. Length of Stay:  7  Days  Subjective: Feels like nothing is much better Patient reports the fetal movement as active. Patient reports uterine contraction  activity as none. Patient reports  vaginal bleeding as none. Patient describes fluid per vagina as None.  Vitals:  Blood pressure 123/73, pulse 88, temperature 98.4 F (36.9 C), temperature source Oral, resp. rate 18, height 5\' 2"  (1.575 m), weight 142 lb (64.4 kg), last menstrual period 08/06/2016, SpO2 100 %. Physical Examination:  General appearance - alert, well appearing, and in no distress Chest - normal effort Abdomen - gravid, NT Fundal Height:  size equals dates Cervical Exam: deferred. Labia are still swollen but less erythematous and starting to dry Extremities: Homans sign is negative, no sign of DVT  Membranes:intact  Fetal Monitoring:  Baseline: 150 bpm, Variability: Good {> 6 bpm), Accelerations: Reactive and Decelerations: Absent  Labs:  Results for orders placed or performed during the hospital encounter of 08/06/16 (from the past 24 hour(s))  Type and screen [facility]   Collection Time: 08/12/16  3:22 PM  Result Value Ref Range   ABO/RH(D) O POS    Antibody Screen NEG    Sample Expiration 08/15/2016      Medications:  Scheduled . acyclovir  650 mg Intravenous Q8H  . nicotine  14 mg Transdermal Daily  . polyethylene glycol  17 g Oral Daily  . prenatal vitamin w/FE, FA  1 tablet Oral Q1200   I have reviewed the patient's current medications.  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

## 2016-08-14 MED ORDER — OXYCODONE HCL 5 MG PO TABS: 5.0000 mg | ORAL_TABLET | Freq: Four times a day (QID) | ORAL | 0 refills | Status: DC | PRN

## 2016-08-14 MED ORDER — METRONIDAZOLE 500 MG PO TABS
500.0000 mg | ORAL_TABLET | Freq: Two times a day (BID) | ORAL | Status: DC
  Filled 2016-08-14: qty 1

## 2016-08-14 MED ORDER — METRONIDAZOLE 500 MG PO TABS: 500.0000 mg | ORAL_TABLET | Freq: Two times a day (BID) | ORAL | 0 refills | Status: DC

## 2016-08-14 MED ORDER — ZOLPIDEM TARTRATE 5 MG PO TABS: 5.0000 mg | ORAL_TABLET | Freq: Every evening | ORAL | 0 refills | Status: DC | PRN

## 2016-08-14 MED ORDER — VALACYCLOVIR HCL 1 G PO TABS: 1000.0000 mg | ORAL_TABLET | Freq: Every day | ORAL | 2 refills | Status: DC

## 2016-08-14 NOTE — Discharge Instructions (Signed)
Herpes Simplex Virus Herpes simplex virus is a viral infection that may infect many different areas of the body, such as the genitalia and mouth. There are two different strains of the virus: herpes simplex virus 1 (HSV-1) and herpes simplex virus 2 (HSV-2). HSV-1 is typically associated with infections of the mouth and lips. HSV-2 is associated with infections of the genitals. However, either strain of the virus may infect any area. HSV may be spread through saliva particles or sexual contact. One unusual form of HSV-1, known as herpes gladiatorum, is passed from skin-to-skin contact, such as in wrestling. SYMPTOMS   Sometimes, no symptoms.  Fever.  Headache.  Muscle aches.  Tingling.  Itching.  Tenderness.  Genital burning feeling.  Genital pain.  Pain with urination.  Pain with sexual intercourse.  Small blisters in the affected areas. RISK FACTORS   Kissing an infected person.  Sharing eating utensils with an infected person.  Unprotected sexual activity.  Multiple sexual partners.  Direct contact sports without protective clothing.  Contact with an exposed herpes sore.  Stress, illness, and cold increase the risk of recurrence. PROGNOSIS  The primary outbreak of an HSV infection usually lasts 2 to 3 weeks. However, it has been known to last up to 6 weeks. After the primary outbreak subsides, the virus goes into a stage known as latency. During this time, there may be no physical symptoms of infection. After a period of time, some event, such as stress, cold, or illness will trigger another outbreak. This cycle of latency and outbreak may continue indefinitely. The outbreaks usually become milder over time. The body cannot rid itself of HSV. RELATED COMPLICATIONS   Recurrence.  Infection in other areas of the body, such as the eye (ocular herpetic infection, keratitis) and rarely the brain (herpetic encephalitis). TREATMENT  Many HSV infections can be treated  without medicine. During an outbreak, avoid touching the sores. Ice may be used to dull the pain and suppress the virus. Exposure to the sun is a common trigger for an outbreak, so the use of sunscreen may help in such cases. Avoid sexual contact during outbreaks. During the latent periods, it is advised that you use latex condoms, which will reduce the likelihood of spreading the virus to another person. Condoms made from animal products do not protect against HSV. Female condoms cover a larger area than female condoms, and may offer the most protection from the transmission of HSV. The presence of HSV will not affect a condom's ability to protect against pregnancy. Only take medicines for pain and discomfort if directed to do so by your caregiver. Many claims exist that certain dietary changes will prevent an outbreak, but these claims have not been proven. These claims include eating foods that are high in L-lysine and low in arginine (i.e. yogurt, beets, apples, pears, mangoes, oily fish (such as salmon, haddock, snapper, and swordfish), soybean sprouts, chicken, and tomatoes).  Athletes may return to play once they are showing no symptoms, and they have been treated.    This information is not intended to replace advice given to you by your health care provider. Make sure you discuss any questions you have with your health care provider.   Document Released: 11/17/2005 Document Revised: 02/09/2012 Document Reviewed: 06/06/2015 Elsevier Interactive Patient Education 2016 Elsevier Inc.  

## 2016-08-14 NOTE — Progress Notes (Signed)
Pt education of the care of the foley cath, keep clean and foley care BID and prn. Pt instructed how to discontinue the foley catheter, pt demonstrating the way to d/c foley. Questions asked and answered and per Dr. Emelda FearFerguson p[t may remove catheter on Monday 9/18. Pt verbalizes understanding

## 2016-08-14 NOTE — Discharge Summary (Signed)
Antenatal Physician Discharge Summary  Patient ID: Lisa Schmitt MRN: [id number] DOB/AGE: May 05, 1996 20 y.o.  Admit date: 08/06/2016 Discharge date: 08/14/2016  Admission Diagnoses:28 weeks' gestation  primary herpes simplex virus type II infection history of polysubstance abuse Hepatitis C chronic  Discharge Diagnoses: Pregnancy 28 weeks 6 days not delivered Primary herpes simplex virus type II infection, improving Chronic hepatitis C History of polysubstance abuse Bacterial vaginosis  Antenatal Unit Procedures: Indwelling Foley catheter  Intrapartum Procedures: None not applicable   Significant Diagnostic Studies:  Results for orders placed or performed during the hospital encounter of 08/06/16 (from the past 168 hour(s))  CBC with Differential/Platelet   Collection Time: 08/09/16  2:01 PM  Result Value Ref Range   WBC 5.7 4.0 - 10.5 K/uL   RBC 3.46 (L) 3.87 - 5.11 MIL/uL   Hemoglobin 10.0 (L) 12.0 - 15.0 g/dL   HCT 40.9 (L) 81.1 - 91.4 %   MCV 86.1 78.0 - 100.0 fL   MCH 28.9 26.0 - 34.0 pg   MCHC 33.6 30.0 - 36.0 g/dL   RDW 78.2 95.6 - 21.3 %   Platelets 176 150 - 400 K/uL   Neutrophils Relative % 63 %   Neutro Abs 3.6 1.7 - 7.7 K/uL   Lymphocytes Relative 29 %   Lymphs Abs 1.6 0.7 - 4.0 K/uL   Monocytes Relative 6 %   Monocytes Absolute 0.4 0.1 - 1.0 K/uL   Eosinophils Relative 1 %   Eosinophils Absolute 0.1 0.0 - 0.7 K/uL   Basophils Relative 1 %   Basophils Absolute 0.0 0.0 - 0.1 K/uL  Comprehensive metabolic panel   Collection Time: 08/09/16  2:01 PM  Result Value Ref Range   Sodium 136 135 - 145 mmol/L   Potassium 3.2 (L) 3.5 - 5.1 mmol/L   Chloride 106 101 - 111 mmol/L   CO2 25 22 - 32 mmol/L   Glucose, Bld 101 (H) 65 - 99 mg/dL   BUN <5 (L) 6 - 20 mg/dL   Creatinine, Ser 0.86 0.44 - 1.00 mg/dL   Calcium 7.9 (L) 8.9 - 10.3 mg/dL   Total Protein 5.3 (L) 6.5 - 8.1 g/dL   Albumin 2.2 (L) 3.5 - 5.0 g/dL   AST 16 15 - 41 U/L   ALT 22 14 - 54 U/L    Alkaline Phosphatase 56 38 - 126 U/L   Total Bilirubin 0.2 (L) 0.3 - 1.2 mg/dL   GFR calc non Af Amer >60 >60 mL/min   GFR calc Af Amer >60 >60 mL/min   Anion gap 5 5 - 15  Type and screen [facility] [place]   Collection Time: 08/09/16  2:01 PM  Result Value Ref Range   ABO/RH(D) O POS    Antibody Screen NEG    Sample Expiration 08/12/2016   Type and screen [facility] [place]   Collection Time: 08/12/16  3:22 PM  Result Value Ref Range   ABO/RH(D) O POS    Antibody Screen NEG    Sample Expiration 08/15/2016     Treatments: IV hydration and intravenous acyclovir converted to oral time of discharge  Hospital Course:  This is a 20 y.o. G1P0000 with IUP at [redacted]w[redacted]d admitted for severe vulvar pain and swelling suspected the primary HSV 2 with antibody testing showing evidence of acute infection. She was admitted with Foley catheter placement analgesics and IV Covera due to the severity of the pain and low pain tolerance is of the patient  She was initially  started on  Lower dose acyclovir and after consult infectious disease raised to 650 mg every 8 hours.     She was deemed stable for discharge to home with outpatient follow upfor pregnancy care through our high-risk clinic. [facility]. [place]  Discharge Exam: BP 115/73 (BP Location: Left Arm)   Pulse 89   Temp 98.1 F (36.7 C) (Oral)   Resp 16   Ht 5\' 2"  (1.575 m)   Wt 64.4 kg (142 lb)   LMP 08/06/2016   SpO2 100%   BMI 25.97 kg/m  General appearance: alert, cooperative, no distress and She remains somewhat skeptical and was hesitant to leave the hospital with Foley catheter in place. We reassured her that she is completely normal and to completely capable of handling the Foley catheter with a leg bag at home Resp: clear to auscultation bilaterally Breasts: normal appearance, no masses or tenderness GI: soft, non-tender; bowel  sounds normal; no masses,  no organomegaly Pelvic: Significant improvement in the severe vulvar edema. The left labia majora is only 1-2 cm more distended than normal. The left labia majora remains slightly more swollen, for 5 cm with and moderate vaginal discharge considered of vaginal origin due to moisture trapping over the last few days from the vulvar swelling patient will be treated on metronidazole upon discharge  Discharge Condition: Stable  Disposition: 01-Home or Self Care  Discharge Instructions    Call MD for:  persistant nausea and vomiting    Complete by:  As directed    Call MD for:  severe uncontrolled pain    Complete by:  As directed    Call MD for:  temperature >100.4    Complete by:  As directed    Diet - low sodium heart healthy    Complete by:  As directed    Increase activity slowly    Complete by:  As directed        Medication List    TAKE these medications   acetaminophen 325 MG tablet Commonly known as:  TYLENOL Take 325 mg by mouth every 6 (six) hours as needed for mild pain.   metroNIDAZOLE 500 MG tablet Commonly known as:  FLAGYL Take 1 tablet (500 mg total) by mouth every 12 (twelve) hours.   oxyCODONE 5 MG immediate release tablet Commonly known as:  Oxy IR/ROXICODONE Take 1 tablet (5 mg total) by mouth every 6 (six) hours as needed for severe pain.   PRENATAL+DHA PO Take 1 tablet by mouth daily.   valACYclovir 1000 MG tablet Commonly known as:  VALTREX Take 1 tablet (1,000 mg total) by mouth daily.   zolpidem 5 MG tablet Commonly known as:  AMBIEN Take 1 tablet (5 mg total) by mouth at bedtime as needed for sleep.      Follow-up Information    [facility]   Specialty:  Obstetrics and Gynecology Why:  call for an appointment for [facility] Contact information: [address] [place] [phone]           Signed: Tilda BurrowFERGUSON,Alexzandrea Normington V M.D. 08/14/2016, 9:08 AM

## 2016-08-18 ENCOUNTER — Encounter: Payer: Self-pay | Admitting: Family Medicine

## 2016-08-18 ENCOUNTER — Telehealth: Payer: Self-pay | Admitting: Family Medicine

## 2016-08-18 NOTE — Telephone Encounter (Signed)
Called patient to inform her about this office not being able to see her. Was not able to leave a voicemail, and the phone just hung up.

## 2017-06-11 ENCOUNTER — Encounter (HOSPITAL_COMMUNITY): Payer: Self-pay

## 2017-11-28 ENCOUNTER — Encounter (HOSPITAL_COMMUNITY): Payer: Self-pay

## 2017-11-28 ENCOUNTER — Emergency Department (HOSPITAL_COMMUNITY)
Admission: EM | Admit: 2017-11-28 | Discharge: 2017-11-28 | Disposition: A | Discharge status: Home / Self Care | Payer: Medicaid Other | Attending: Emergency Medicine | Admitting: Emergency Medicine

## 2017-11-28 ENCOUNTER — Emergency Department (HOSPITAL_COMMUNITY): Payer: Medicaid Other

## 2017-11-28 DIAGNOSIS — F17210 Nicotine dependence, cigarettes, uncomplicated: Secondary | ICD-10-CM | POA: Diagnosis not present

## 2017-11-28 DIAGNOSIS — N10 Acute tubulo-interstitial nephritis: Secondary | ICD-10-CM | POA: Insufficient documentation

## 2017-11-28 DIAGNOSIS — N12 Tubulo-interstitial nephritis, not specified as acute or chronic: Secondary | ICD-10-CM

## 2017-11-28 DIAGNOSIS — N764 Abscess of vulva: Secondary | ICD-10-CM | POA: Insufficient documentation

## 2017-11-28 DIAGNOSIS — L0291 Cutaneous abscess, unspecified: Secondary | ICD-10-CM

## 2017-11-28 LAB — COMPREHENSIVE METABOLIC PANEL
ALBUMIN: 3.4 g/dL — AB (ref 3.5–5.0)
ALK PHOS: 68 U/L (ref 38–126)
ALT: 15 U/L (ref 14–54)
AST: 17 U/L (ref 15–41)
Anion gap: 6 (ref 5–15)
BILIRUBIN TOTAL: 0.4 mg/dL (ref 0.3–1.2)
BUN: 12 mg/dL (ref 6–20)
CO2: 25 mmol/L (ref 22–32)
CREATININE: 0.8 mg/dL (ref 0.44–1.00)
Calcium: 8.3 mg/dL — ABNORMAL LOW (ref 8.9–10.3)
Chloride: 108 mmol/L (ref 101–111)
GFR calc Af Amer: >60 mL/min (ref >60)
Glucose, Bld: 102 mg/dL — ABNORMAL HIGH (ref 65–99)
POTASSIUM: 3.4 mmol/L — AB (ref 3.5–5.1)
Sodium: 139 mmol/L (ref 135–145)
TOTAL PROTEIN: 6 g/dL — AB (ref 6.5–8.1)

## 2017-11-28 LAB — CBC WITH DIFFERENTIAL/PLATELET
BASOS ABS: 0 10*3/uL (ref 0.0–0.1)
BASOS PCT: 0 %
Eosinophils Absolute: 0.3 10*3/uL (ref 0.0–0.7)
Eosinophils Relative: 2 %
HEMATOCRIT: 35.4 % — AB (ref 36.0–46.0)
HEMOGLOBIN: 11.5 g/dL — AB (ref 12.0–15.0)
LYMPHS PCT: 30 %
Lymphs Abs: 4 10*3/uL (ref 0.7–4.0)
MCH: 28.7 pg (ref 26.0–34.0)
MCHC: 32.5 g/dL (ref 30.0–36.0)
MCV: 88.3 fL (ref 78.0–100.0)
Monocytes Absolute: 1 10*3/uL (ref 0.1–1.0)
Monocytes Relative: 8 %
NEUTROS ABS: 7.8 10*3/uL — AB (ref 1.7–7.7)
NEUTROS PCT: 60 %
Platelets: 277 10*3/uL (ref 150–400)
RBC: 4.01 MIL/uL (ref 3.87–5.11)
RDW: 13 % (ref 11.5–15.5)
WBC: 13 10*3/uL — ABNORMAL HIGH (ref 4.0–10.5)

## 2017-11-28 LAB — URINALYSIS, ROUTINE W REFLEX MICROSCOPIC
Bilirubin Urine: NEGATIVE
Glucose, UA: NEGATIVE mg/dL
HGB URINE DIPSTICK: NEGATIVE
Ketones, ur: NEGATIVE mg/dL
NITRITE: NEGATIVE
PROTEIN: NEGATIVE mg/dL
SPECIFIC GRAVITY, URINE: 1.025 (ref 1.005–1.030)
pH: 6 (ref 5.0–8.0)

## 2017-11-28 LAB — POC URINE PREG, ED: PREG TEST UR: NEGATIVE

## 2017-11-28 MED ORDER — HYDROMORPHONE HCL 1 MG/ML IJ SOLN
0.5000 mg | Freq: Once | INTRAMUSCULAR | Status: AC
  Administered 2017-11-28: 0.5 mg via INTRAVENOUS
  Filled 2017-11-28: qty 1

## 2017-11-28 MED ORDER — DEXTROSE 5 % IV SOLN
1.0000 g | Freq: Once | INTRAVENOUS | Status: AC
  Administered 2017-11-28: 1 g via INTRAVENOUS
  Filled 2017-11-28: qty 10

## 2017-11-28 MED ORDER — KETOROLAC TROMETHAMINE 30 MG/ML IJ SOLN
15.0000 mg | Freq: Once | INTRAMUSCULAR | Status: AC
  Administered 2017-11-28: 15 mg via INTRAVENOUS
  Filled 2017-11-28: qty 1

## 2017-11-28 MED ORDER — CEPHALEXIN 500 MG PO CAPS: 500.0000 mg | ORAL_CAPSULE | Freq: Four times a day (QID) | ORAL | 0 refills | Status: DC

## 2017-11-28 MED ORDER — LIDOCAINE HCL 2 % IJ SOLN
20.0000 mL | Freq: Once | INTRAMUSCULAR | Status: AC
  Administered 2017-11-28: 400 mg
  Filled 2017-11-28: qty 20

## 2017-11-28 NOTE — ED Provider Notes (Signed)
[facility] EMERGENCY DEPARTMENT Provider Note   CSN: [id number] Arrival date & time: 11/28/17  4098     History   Chief Complaint Chief Complaint  Patient presents with  . Flank Pain    HPI Lisa Schmitt is a 21 y.o. female.  The history is provided by the patient and medical records. No language interpreter was used.   Lisa Schmitt is a 21 y.o. female  with a PMH of hepatitis C who presents to the Emergency Department with multiple complaints:  1. Persistent dry cough, sneezing, scratchy throat x 1 week. No medications taken for these symptoms. No known sick contacts. No chest pain or shortness of breath. No fever or chills.   2. Dysuria and right flank pain x 1 week. Last night she noticed bilateral back ache and felt nauseous. No nausea currently. No vomiting. No vaginal discharge. Again, no fevers/chills. She thought she had a UTI about 2 weeks ago.  She took Azo over-the-counter and symptoms resolved.  The symptoms then returned about a week ago and she has taken no medication for this since Azo.   3.  Abscess to the right groin which has been progressively worsening over the last 2 weeks.  No active drainage. No surrounding redness. Denies history of similar to the groin, but has had abscess to the breast before. No alleviating or aggravating factors noted.    Past Medical History:  Diagnosis Date  . Depression   . Hepatitis C   . Infection   . Scar of abdomen 03/2012  . Suicide attempt by drug ingestion (HCC) 03/2012    Patient Active Problem List   Diagnosis Date Noted  . 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 Supervision of high risk pregnancy, antepartum 04/24/2016  . Chronic hepatitis C affecting pregnancy, antepartum (HCC) 03/27/2016  . History of breast abscess 03/27/2016  . History of depression 03/27/2016  . Nausea and vomiting during  pregnancy 03/27/2016  . Scar of abdominal wall 03/22/2012    Past Surgical History:  Procedure Laterality Date  . APPENDECTOMY  age 10 yrs.  Marland Kitchen HERNIA REPAIR    . SCAR REVISION  03/22/2012   Procedure: SCAR REVISION;  Surgeon: Wayland Denis, DO;  Location: [facility];  Service: Plastics;  Laterality: Right;  Excision painful abdominal appendix scar contracture    OB History    Gravida Para Term Preterm AB Living   1 0 0 0 0 0   SAB TAB Ectopic Multiple Live Births   0 0 0 0 0       Home Medications    Prior to Admission medications   Medication Sig Start Date End Date Taking? Authorizing Provider  acetaminophen (TYLENOL) 325 MG tablet Take 325 mg by mouth every 6 (six) hours as needed for mild pain.    [provider]  cephALEXin (KEFLEX) 500 MG capsule Take 1 capsule (500 mg total) by mouth 4 (four) times daily. 11/28/17   Nazeer Romney, Chase Picket, PA-C  metroNIDAZOLE (FLAGYL) 500 MG tablet Take 1 tablet (500 mg total) by mouth every 12 (twelve) hours. 08/14/16   Tilda Burrow, MD  oxyCODONE (OXY IR/ROXICODONE) 5 MG immediate release tablet Take 1 tablet (5 mg total) by mouth every 6 (six) hours as needed for severe pain. 08/14/16   Tilda Burrow, MD  Prenatal MV-Min-Fe Fum-FA-DHA (PRENATAL+DHA PO) Take 1 tablet by mouth  daily.    [provider]  valACYclovir (VALTREX) 1000 MG tablet Take 1 tablet (1,000 mg total) by mouth daily. 08/14/16   Tilda Burrow, MD  zolpidem (AMBIEN) 5 MG tablet Take 1 tablet (5 mg total) by mouth at bedtime as needed for sleep. 08/14/16   Tilda Burrow, MD    Family History No family history on file.  Social History Social History   Tobacco Use  . Smoking status: Current Every Day Smoker    Packs/day: 1.00    Types: Cigarettes  . Smokeless tobacco: Current User  . Tobacco comment: outside smokers at home  Substance Use Topics  . Alcohol use: No  . Drug use: No     Allergies   Vicodin  [hydrocodone-acetaminophen] and Penicillins   Review of Systems Review of Systems  Constitutional: Negative for chills and fever.  HENT: Positive for congestion, sneezing and sore throat. Negative for trouble swallowing.   Respiratory: Positive for cough. Negative for shortness of breath.   Gastrointestinal: Positive for nausea. Negative for abdominal pain, blood in stool, constipation, diarrhea and vomiting.  Genitourinary: Positive for dysuria and frequency. Negative for vaginal discharge.  Skin: Positive for wound.  All other systems reviewed and are negative.    Physical Exam Updated Vital Signs BP (!) 125/55 (BP Location: Left Arm)   Pulse 95   Temp 98.3 F (36.8 C) (Oral)   Resp 14   Ht 5\' 2"  (1.575 m)   LMP 11/01/2017 (Approximate)   SpO2 100%   BMI 25.97 kg/m   Physical Exam  Constitutional: She is oriented to person, place, and time. She appears well-developed and well-nourished. No distress.  HENT:  Head: Normocephalic and atraumatic.  OP with scant erythema, no tonsillar hypertrophy or exudates. No focal sinus tenderness.  Cardiovascular: Normal rate, regular rhythm and normal heart sounds.  No murmur heard. Pulmonary/Chest: Effort normal. No respiratory distress.  Lungs clear to auscultation bilaterally.  Abdominal: Soft. She exhibits no distension.  Mild suprapubic tenderness with no rebound or guarding.  Bilateral CVA tenderness.  Genitourinary:  Genitourinary Comments: 1x2 area of fluctuance to the outer right labia which is tender to palpation. No drainage. No surrounding erythema, no warmth.  Neurological: She is alert and oriented to person, place, and time.  Skin: Skin is warm and dry.  Nursing note and vitals reviewed.    ED Treatments / Results  Labs (all labs ordered are listed, but only abnormal results are displayed) Labs Reviewed  URINALYSIS, ROUTINE W REFLEX MICROSCOPIC - Abnormal; Notable for the following components:      Result Value    APPearance HAZY (*)    Leukocytes, UA TRACE (*)    Bacteria, UA RARE (*)    Squamous Epithelial / LPF 6-30 (*)    All other components within normal limits  CBC WITH DIFFERENTIAL/PLATELET - Abnormal; Notable for the following components:   WBC 13.0 (*)    Hemoglobin 11.5 (*)    HCT 35.4 (*)    Neutro Abs 7.8 (*)    All other components within normal limits  COMPREHENSIVE METABOLIC PANEL - Abnormal; Notable for the following components:   Potassium 3.4 (*)    Glucose, Bld 102 (*)    Calcium 8.3 (*)    Total Protein 6.0 (*)    Albumin 3.4 (*)    All other components within normal limits  POC URINE PREG, ED    EKG  EKG Interpretation None       Radiology Ct  Renal Stone Study  Result Date: 11/28/2017 CLINICAL DATA:  Dysuria with low back pain. EXAM: CT ABDOMEN AND PELVIS WITHOUT CONTRAST TECHNIQUE: Multidetector CT imaging of the abdomen and pelvis was performed following the standard protocol without IV contrast. COMPARISON:  CT abdomen pelvis dated January 18, 2016. FINDINGS: Lower chest: No acute abnormality. Hepatobiliary: No focal liver abnormality is seen. The gallbladder is contracted. No gallstones or biliary dilatation. Pancreas: Unremarkable. No pancreatic ductal dilatation or surrounding inflammatory changes. Spleen: Normal in size without focal abnormality. Adrenals/Urinary Tract: Adrenal glands are unremarkable. Kidneys are normal, without renal calculi, focal lesion, or hydronephrosis. Mild bladder wall thickening. Stomach/Bowel: Stomach is within normal limits. Appendix is surgically absent. No evidence of bowel wall thickening, distention, or inflammatory changes. Large colonic stool burden. Vascular/Lymphatic: No significant vascular findings are present. No enlarged abdominal or pelvic lymph nodes. Reproductive: Uterus and bilateral adnexa are unremarkable. Other: No free fluid or pneumoperitoneum. Musculoskeletal: No acute or significant osseous findings. IMPRESSION:  1. No evidence of obstructive uropathy. Mild bladder wall thickening, which could reflect cystitis. Correlate with urinalysis. Electronically Signed   By: Obie DredgeWilliam T Derry M.D.   On: 11/28/2017 14:02    Procedures .Marland Kitchen.Incision and Drainage Date/Time: 11/28/2017 3:43 PM Performed by: Mahathi Pokorney, Chase PicketJaime Pilcher, PA-C Authorized by: Brendaliz Kuk, Chase PicketJaime Pilcher, PA-C   Consent:    Consent obtained:  Verbal   Consent given by:  Patient   Risks discussed:  Bleeding, incomplete drainage, pain and infection Location:    Type:  Abscess   Size:  1x2   Location:  Anogenital   Anogenital location:  Vulva Pre-procedure details:    Skin preparation:  Betadine Anesthesia (see MAR for exact dosages):    Anesthesia method:  Local infiltration   Local anesthetic:  Lidocaine 2% w/o epi (3ml) Procedure type:    Complexity:  Simple Procedure details:    Incision types:  Single straight   Scalpel blade:  11   Wound management:  Probed and deloculated   Drainage:  Purulent   Drainage amount:  Moderate   Wound treatment:  Wound left open   Packing materials:  None Post-procedure details:    Patient tolerance of procedure:  Tolerated well, no immediate complications   (including critical care time)  Medications Ordered in ED Medications  lidocaine (XYLOCAINE) 2 % (with pres) injection 400 mg (400 mg Infiltration Given by Other 11/28/17 1400)  ketorolac (TORADOL) 30 MG/ML injection 15 mg (15 mg Intravenous Given 11/28/17 1437)  cefTRIAXone (ROCEPHIN) 1 g in dextrose 5 % 50 mL IVPB (1 g Intravenous New Bag/Given 11/28/17 1522)  HYDROmorphone (DILAUDID) injection 0.5 mg (0.5 mg Intravenous Given 11/28/17 1518)     Initial Impression / Assessment and Plan / ED Course  I have reviewed the triage vital signs and the nursing notes.  Pertinent labs & imaging results that were available during my care of the patient were reviewed by me and considered in my medical decision making (see chart for details).    Lisa SlateLindsay  E Schmitt is a 21 y.o. female who presents to ED for multiple complaints:  1. Cough, congestion, sneezing, scratchy throat. Lungs CTA bilaterally. OP with scant erythema, no exudates or tonsillar hypertrophy. Likely viral. Discussed supportive care and PCP follow up if symptoms persist.   2. Abscess to right outer labia requiring incision and drainage. I&D performed per procedure note above. Patient tolerated the procedure well.  No evidence of surrounding erythema to suggest cellulitis. Wound check in 2-3 days encouraged.   3. Dysuria  and flank pain x 1 week. Patient is afebrile, hemodynamically stable. She does have CVA tenderness on exam. UA with trace leukocytes and 6-30 WBC's. CT shows mild bladder wall thickening concerning for cystitis. Given dose of Rocephin in ED. Will treat with keflex. Patient reports one time in high school that she did IV drugs, but since then, denies hx of IVDU. Considered epidural abscess as possible etiology. Given CT findings, urinary symptoms and UA, likely 2/2 pyelo, however did discuss obtaining MRI for further evaluation to r/o abscess. Patient states that she does not want to wait in ED and would like to go home. She declines further imaging. I informed patient of the importance of returning to ER if symptoms are not improving with rx for keflex or if she develops fever, vomiting, worsening back pain, new or worsening symptoms, any additional concerns. Spoke to patient about low threshold to return to ER. PCP follow up encouraged strongly. All questions answered.    Patient discussed with Dr. Rosalia Hammersay who agrees with treatment plan.    Final Clinical Impressions(s) / ED Diagnoses   Final diagnoses:  Pyelonephritis  Abscess    ED Discharge Orders        Ordered    cephALEXin (KEFLEX) 500 MG capsule  4 times daily     11/28/17 1532       Kelby Lotspeich, Chase PicketJaime Pilcher, PA-C 11/28/17 1552    Margarita Grizzleay, Danielle, MD 11/29/17 1252

## 2017-11-28 NOTE — Discharge Instructions (Signed)
Stay very well hydrated with plenty of water throughout the day. Please take antibiotic until completion. Follow up with primary care physician in 1 week for recheck of ongoing symptoms.  Please seek immediate care if you develop the following: Your symptoms are no better or worse in 3 days. There is severe back pain or lower abdominal pain.  You have a fever.  There is vomiting.  There is continued burning or discomfort with urination.  You have any additional concerns.

## 2017-11-28 NOTE — ED Triage Notes (Addendum)
Pt presents for evaluation of R flank pain and UTI symptoms x 1 week. Pt reports also has cough and "knot" on chest. Pt reports R sided groin abscess as well.

## 2018-05-25 IMAGING — CT CT RENAL STONE PROTOCOL
2 of 4 series · 16 of 46 positions shown, 18 images · non-contrast
Comparison: CT abdomen pelvis dated January 18, 2016.

CLINICAL DATA: Dysuria with low back pain.

EXAM:
CT ABDOMEN AND PELVIS WITHOUT CONTRAST
TECHNIQUE: Multidetector CT imaging of the abdomen and pelvis was performed
following the standard protocol without IV contrast.

[Series 3: ap without · axial · non-contrast · 0.62mm/px · z∈[+486,+846]mm · 13 of 84 slices shown, 15 images]
[im 6/84  soft-tissue]
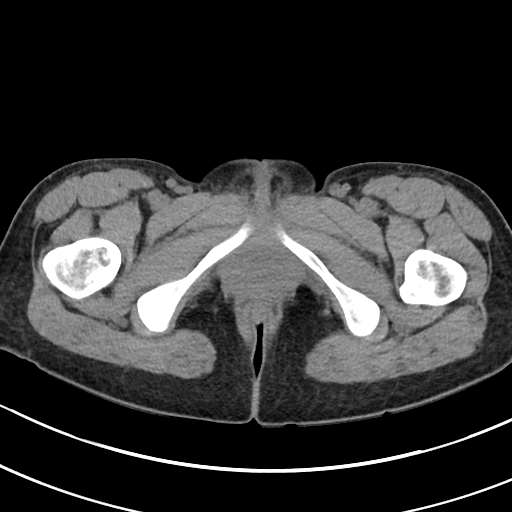
[im 6/84  bone]
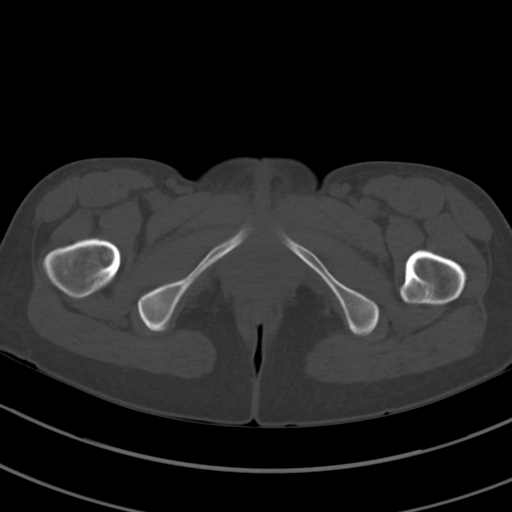
[im 11/84  soft-tissue]
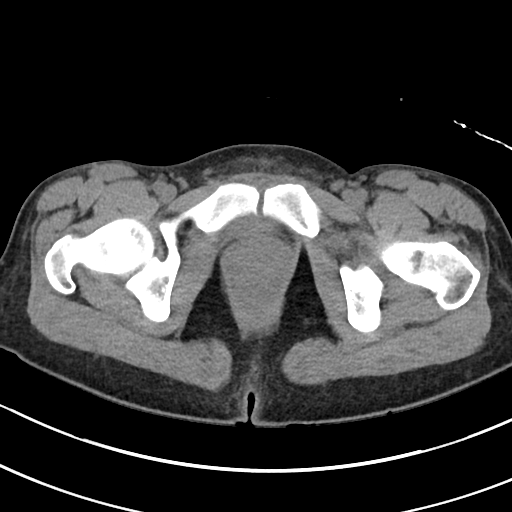
[im 16/84  soft-tissue]
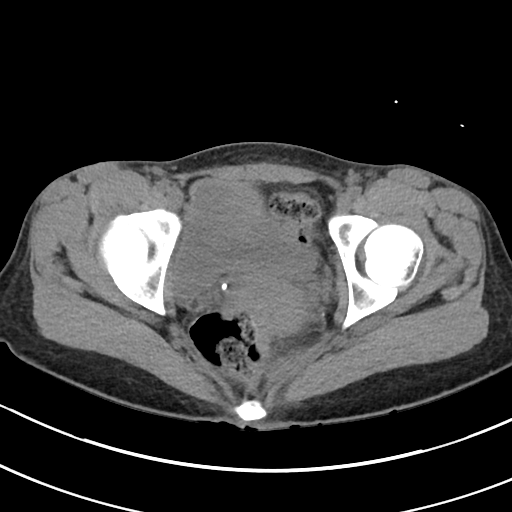
[im 26/84  soft-tissue]
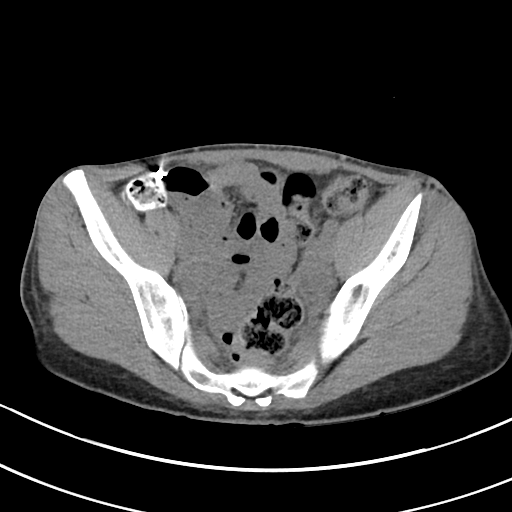
[im 32/84  soft-tissue]
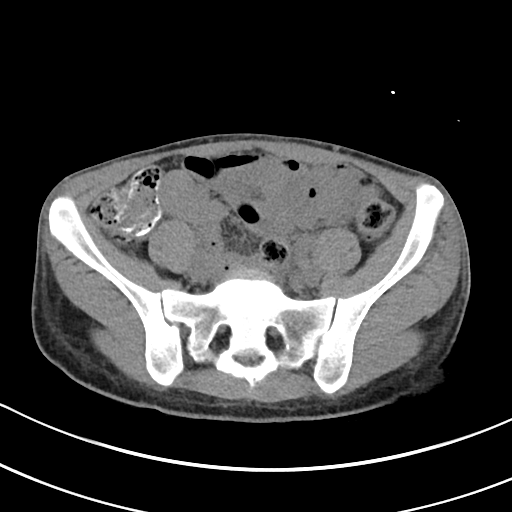
[im 37/84  soft-tissue]
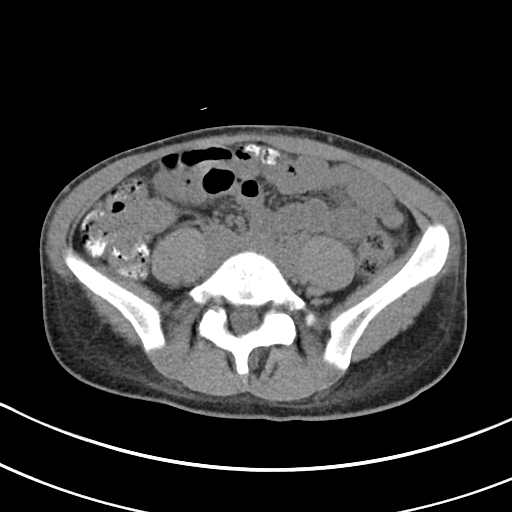
[im 42/84  soft-tissue]
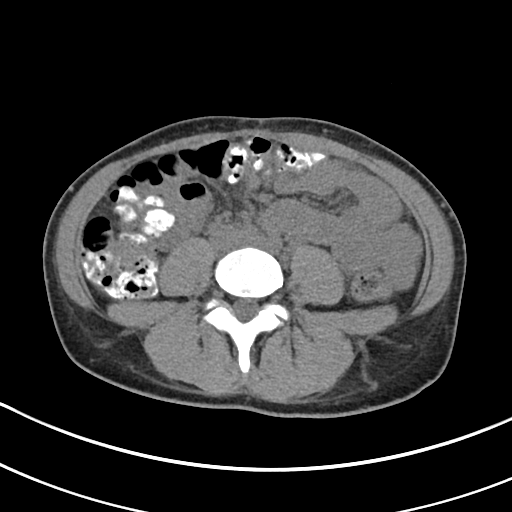
[im 47/84  soft-tissue]
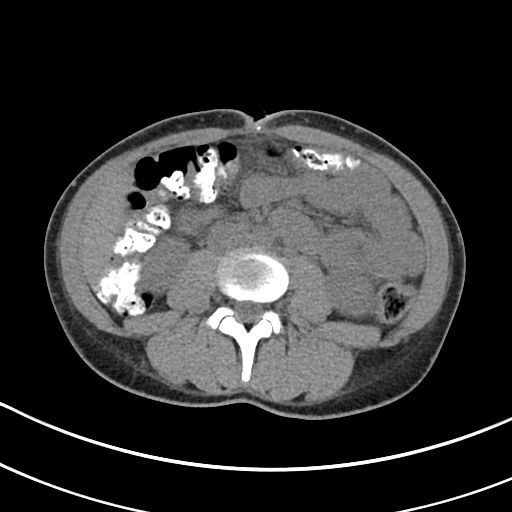
[im 52/84  soft-tissue]
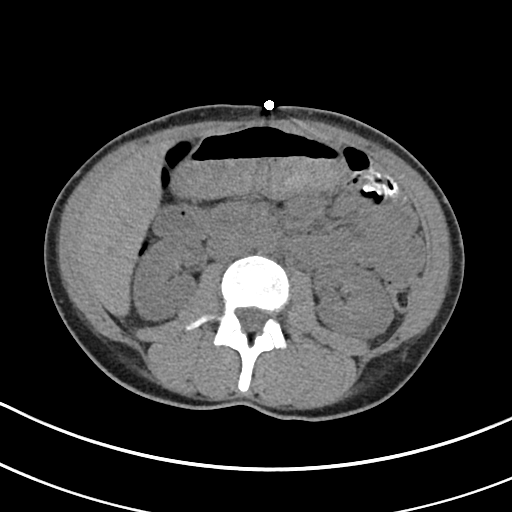
[im 52/84  bone]
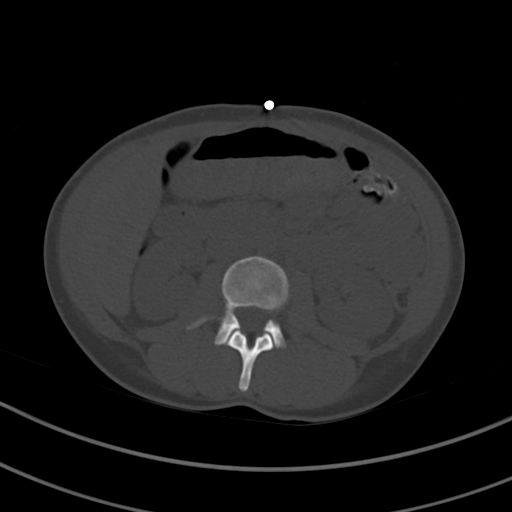
[im 58/84  soft-tissue]
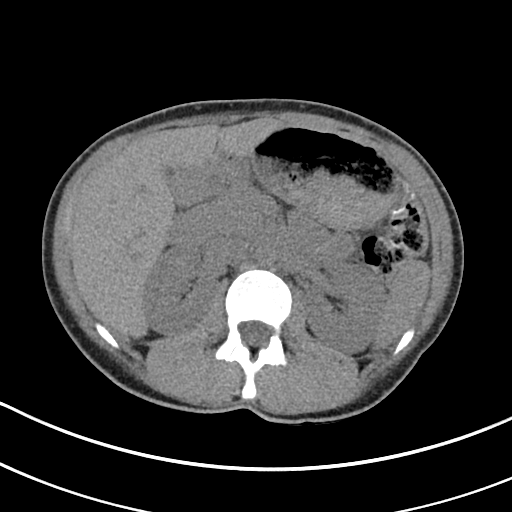
[im 68/84  soft-tissue]
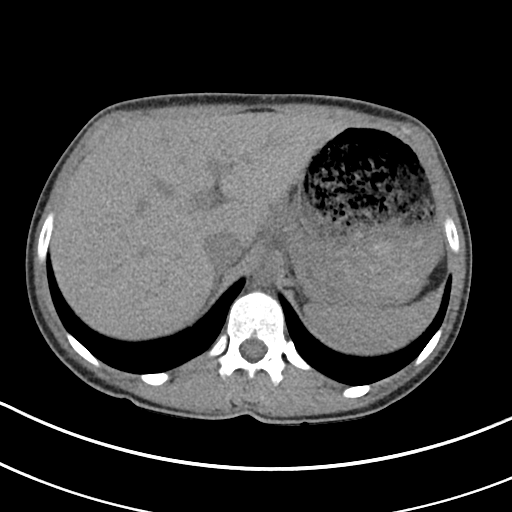
[im 73/84  soft-tissue]
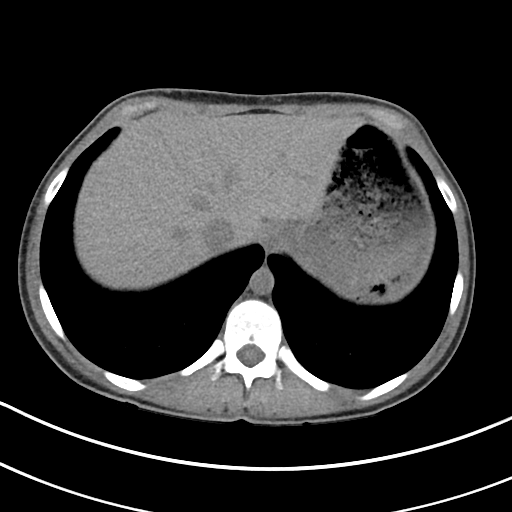
[im 78/84  soft-tissue]
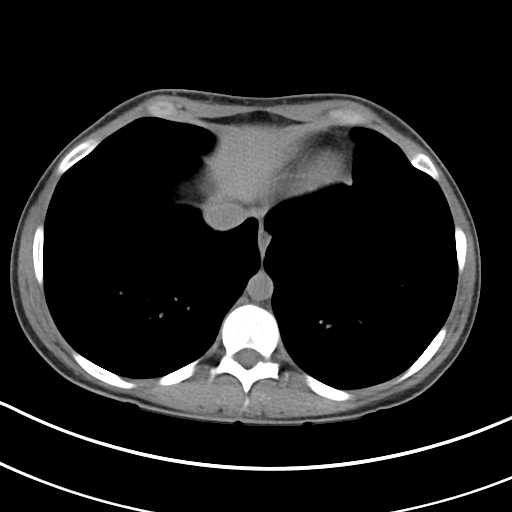

[Series 6: cor · coronal · 0.63mm/px · 3 of 66 slices shown]
[im 22/66  soft-tissue]
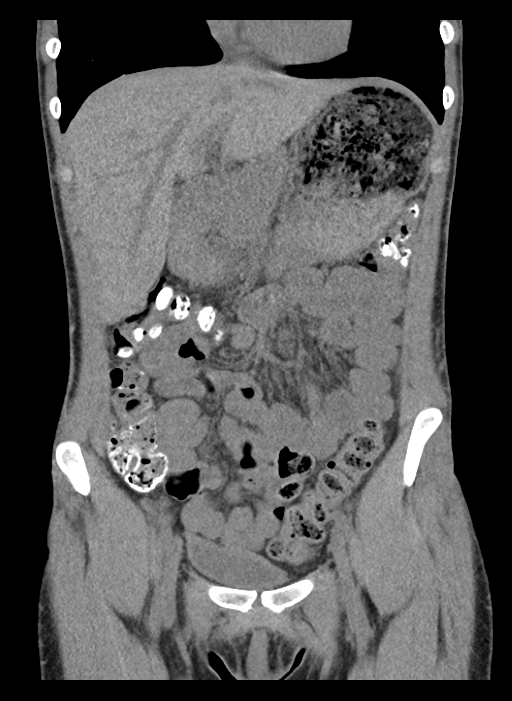
[im 29/66  soft-tissue]
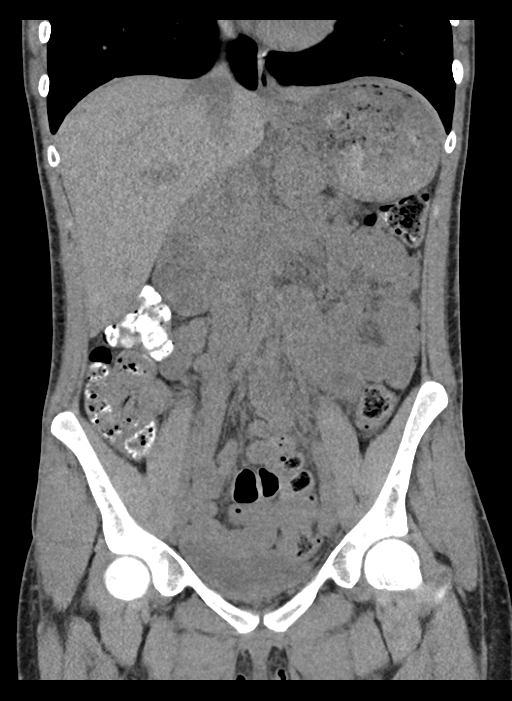
[im 37/66  soft-tissue]
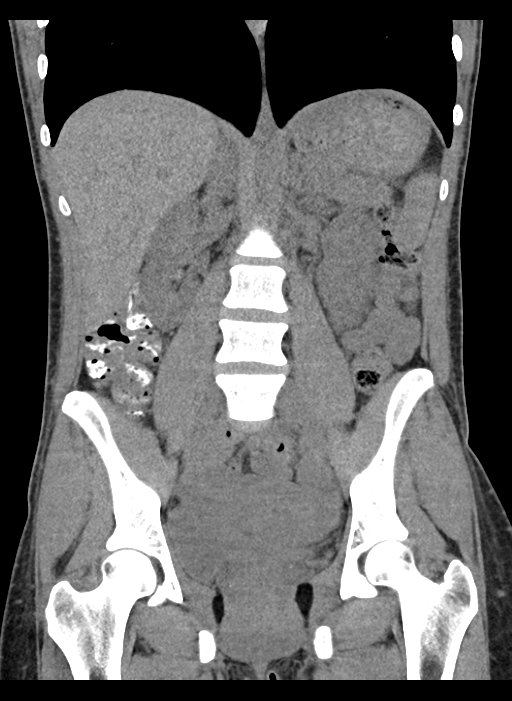

[16 of 46 positions shown; findings below may reference images not displayed]

FINDINGS: Lower chest: No acute abnormality.

Hepatobiliary: No focal liver abnormality is seen. The gallbladder
is contracted. No gallstones or biliary dilatation.

Pancreas: Unremarkable. No pancreatic ductal dilatation or
surrounding inflammatory changes.

Spleen: Normal in size without focal abnormality.

Adrenals/Urinary Tract: Adrenal glands are unremarkable. Kidneys are
normal, without renal calculi, focal lesion, or hydronephrosis. Mild
bladder wall thickening.

Stomach/Bowel: Stomach is within normal limits. Appendix is
surgically absent. No evidence of bowel wall thickening, distention,
or inflammatory changes. Large colonic stool burden.

Vascular/Lymphatic: No significant vascular findings are present. No
enlarged abdominal or pelvic lymph nodes.

Reproductive: Uterus and bilateral adnexa are unremarkable.

Other: No free fluid or pneumoperitoneum.

Musculoskeletal: No acute or significant osseous findings.
IMPRESSION: 1. No evidence of obstructive uropathy. Mild bladder wall
thickening, which could reflect cystitis. Correlate with urinalysis.

## 2018-08-06 DIAGNOSIS — F411 Generalized anxiety disorder: Secondary | ICD-10-CM

## 2018-08-06 DIAGNOSIS — F3130 Bipolar disorder, current episode depressed, mild or moderate severity, unspecified: Secondary | ICD-10-CM

## 2018-08-31 ENCOUNTER — Encounter: Payer: Self-pay | Admitting: Psychiatry

## 2018-08-31 ENCOUNTER — Ambulatory Visit: Payer: Self-pay | Admitting: Psychiatry

## 2018-08-31 ENCOUNTER — Telehealth: Payer: Self-pay | Admitting: Psychiatry

## 2018-08-31 DIAGNOSIS — F3163 Bipolar disorder, current episode mixed, severe, without psychotic features: Secondary | ICD-10-CM

## 2018-08-31 DIAGNOSIS — F411 Generalized anxiety disorder: Secondary | ICD-10-CM

## 2018-08-31 MED ORDER — ARIPIPRAZOLE 10 MG PO TABS: ORAL_TABLET | ORAL | 1 refills | Status: DC

## 2018-08-31 MED ORDER — LAMOTRIGINE 150 MG PO TABS: ORAL_TABLET | ORAL | 1 refills | Status: DC

## 2018-08-31 NOTE — Progress Notes (Signed)
Lisa Schmitt [id number] Apr 08, 1996 22 y.o.  Assessment: Plan:    Bipolar disorder, current episode mixed, severe, without psychotic features (HCC) - Plan: lamoTRIgine (LAMICTAL) 150 MG tablet, ARIPiprazole (ABILIFY) 10 MG tablet  Generalized anxiety disorder Case staffed with Dr. Jennelle Human. Pt seen for 30 minutes and greater than 50% of visit spent counseling pt re: medications during pregnancy. Discussed that Lamictal is generally considered to be safe during pregnancy and recommend restarting Lamictal since it has worked well for her mood s/s. Will re-start Lamictal 75 mg po qd x 1 week, then increase to 150 mg po qd for mood stabilization. Reviewed continuing to monitor for rash and calling office immediately if rash develops. Will start Abilify 10 mg 1/2 tab po qd x 1 week, then increase to 1 tab po qd for mood and anixety. Discussed potential benefits, risks, and side effects of Abilify to include potential risk for movement side effects/ akathisia.  Advised pt to contact office with any worsening s/s.   Please see After Visit Summary for patient specific instructions.  Future Appointments  Date Time Provider Department Center  10/11/2018  2:45 PM Corie Chiquito, PMHNP [facility] None    No orders of the defined types were placed in this encounter.      Subjective:   Patient ID:  Lisa Schmitt is a 22 y.o. (DOB September 17, 1996) female.  Chief Complaint:  Chief Complaint  Patient presents with  . Depression  . Anxiety    HPI Lisa Schmitt presents to the office today for f/u of anxiety and mood. She reports that she learned last Tuesday that she is pregnant. Reports LMP was 07/18/18. Has not yet seen an obgyn. Reports that she abruptly stopped Trileptal and Lamictal after learning she was pregnant. Reports that she notices increased irritability, low energy, low motivation, and feeling easily irritated since stopping medications. Reports depressed mood. Denies mood lability- "just  down." Reports increased anxiety. Denies panic attacks. Reports frequently feeling nervous. Sleeping well. Appetite has been ok other than some morning sickness. Reports poor concentration and focus. Denies drug use and reports that she had impulses to use when she first learned she was pregnant. Denies any other risky or impulsive behaviors. Denies excessive spending. Denies SI or HI.   Reports that her mood was stable prior to stopping medications.   Medications: I have reviewed the patient's current medications.  Allergies:  Allergies  Allergen Reactions  . Vicodin [Hydrocodone-Acetaminophen]     hives  . Penicillins Hives    Past Medical History:  Diagnosis Date  . Anxiety   . Bipolar disorder (HCC)   . Depression   . Hepatitis C   . Infection   . Scar of abdomen 03/2012  . Suicide attempt by drug ingestion (HCC) 03/2012    Past Surgical History:  Procedure Laterality Date  . APPENDECTOMY  age 7 yrs.  Marland Kitchen HERNIA REPAIR    . SCAR REVISION  03/22/2012   Procedure: SCAR REVISION;  Surgeon: Wayland Denis, DO;  Location: [facility];  Service: Plastics;  Laterality: Right;  Excision painful abdominal appendix scar contracture    History reviewed. No pertinent family history.  Social History   Socioeconomic History  . Marital status: Single    Spouse name: Not on file  . Number of children: Not on file  . Years of education: Not on file  . Highest education level: Not on file  Occupational History  . Not on file  Social Needs  .  Financial resource strain: Not on file  . Food insecurity:    Worry: Not on file    Inability: Not on file  . Transportation needs:    Medical: Not on file    Non-medical: Not on file  Tobacco Use  . Smoking status: Current Every Day Smoker    Packs/day: 0.25    Types: Cigarettes  . Smokeless tobacco: Current User  . Tobacco comment: outside smokers at home  Substance and Sexual Activity  . Alcohol use: No  . Drug use: No   . Sexual activity: Yes  Lifestyle  . Physical activity:    Days per week: Not on file    Minutes per session: Not on file  . Stress: Not on file  Relationships  . Social connections:    Talks on phone: Not on file    Gets together: Not on file    Attends religious service: Not on file    Active member of club or organization: Not on file    Attends meetings of clubs or organizations: Not on file    Relationship status: Not on file  . Intimate partner violence:    Fear of current or ex partner: Not on file    Emotionally abused: Not on file    Physically abused: Not on file    Forced sexual activity: Not on file  Other Topics Concern  . Not on file  Social History Narrative  . Not on file    Past Medical History, Surgical history, Social history, and Family history were reviewed and updated as appropriate.   Please see review of systems for further details on the patient's review from today.   Review of Systems:  Review of Systems  Constitutional: Positive for fatigue. Negative for activity change, appetite change and unexpected weight change.  Gastrointestinal: Positive for nausea.       Reports nausea in morning secondary to pregnancy  Musculoskeletal: Negative for gait problem.  Neurological: Negative for tremors.  Psychiatric/Behavioral: Negative for agitation, behavioral problems, confusion, decreased concentration, dysphoric mood, hallucinations, sleep disturbance and suicidal ideas. The patient is not nervous/anxious and is not hyperactive.     Objective:   Physical Exam:  There were no vitals taken for this visit.  Physical Exam  Constitutional: She is oriented to person, place, and time. She appears well-developed. No distress.  Musculoskeletal: Normal range of motion.  Neurological: She is alert and oriented to person, place, and time. Coordination normal.  Psychiatric: Her speech is normal. Judgment and thought content normal. Her mood appears anxious. Her  affect is not blunt, not labile and not inappropriate. She is withdrawn. Cognition and memory are normal. She does not express impulsivity. She exhibits a depressed mood. She expresses no homicidal and no suicidal ideation. She expresses no suicidal plans and no homicidal plans.    Lab Review:     Component Value Date/Time   NA 139 11/28/2017 1320   K 3.4 (L) 11/28/2017 1320   CL 108 11/28/2017 1320   CO2 25 11/28/2017 1320   GLUCOSE 102 (H) 11/28/2017 1320   BUN 12 11/28/2017 1320   CREATININE 0.80 11/28/2017 1320   CALCIUM 8.3 (L) 11/28/2017 1320   PROT 6.0 (L) 11/28/2017 1320   ALBUMIN 3.4 (L) 11/28/2017 1320   AST 17 11/28/2017 1320   ALT 15 11/28/2017 1320   ALKPHOS 68 11/28/2017 1320   BILITOT 0.4 11/28/2017 1320   GFRNONAA >60 11/28/2017 1320   GFRAA >60 11/28/2017 1320  Component Value Date/Time   WBC 13.0 (H) 11/28/2017 1320   RBC 4.01 11/28/2017 1320   HGB 11.5 (L) 11/28/2017 1320   HCT 35.4 (L) 11/28/2017 1320   PLT 277 11/28/2017 1320   MCV 88.3 11/28/2017 1320   MCH 28.7 11/28/2017 1320   MCHC 32.5 11/28/2017 1320   RDW 13.0 11/28/2017 1320   LYMPHSABS 4.0 11/28/2017 1320   MONOABS 1.0 11/28/2017 1320   EOSABS 0.3 11/28/2017 1320   BASOSABS 0.0 11/28/2017 1320   -------------------------------

## 2018-08-31 NOTE — Patient Instructions (Signed)
Take Lamictal 75 mg daily for one week, then increase to 150 mg daily.  Start Abilify 10 mg 1/2 tablet daily for one week, then increase to 1 tab daily.

## 2018-08-31 NOTE — Telephone Encounter (Signed)
Pt called and reported Abilify was cost prohibitive at [facility]. Script sent to [facility] as requested

## 2018-09-03 ENCOUNTER — Telehealth: Payer: Self-pay | Admitting: Psychiatry

## 2018-09-03 DIAGNOSIS — F3163 Bipolar disorder, current episode mixed, severe, without psychotic features: Secondary | ICD-10-CM

## 2018-09-03 NOTE — Telephone Encounter (Signed)
Pt called and says she started taking the abilfy 2 days ago and is having bad side effects. She is having head pain , blurred vision and lightheadiness . What should she do. Please call [phone].

## 2018-09-07 MED ORDER — QUETIAPINE FUMARATE ER 150 MG PO TB24: ORAL_TABLET | ORAL | 0 refills | Status: DC

## 2018-09-07 NOTE — Telephone Encounter (Signed)
Pt called to report multiple side effects with Abilify and that she stopped Abilify due to tolerability issues. Pt requesting alternative medication.  Plan: Sent in Seroquel XR since this was discussed as possible tx option at last visit. Discussed that n/v is likely to be less with Seroquel XR compared to Seroquel IR. Will call in Seroquel XR 150 mg tabs for her to take 1/2 tab po QHS x 3 nights, then increase to 1 tab po QHS. Please let her know that we may need to further increase dose to control s/s, however will start with lower dose to make sure she tolerates it without difficulty. Please let her know she should take it about 4 hours before bedtime and preferably on an empty stomach.

## 2018-09-07 NOTE — Telephone Encounter (Signed)
Spoke with pt and she stopped Abilify after taking for 2 days, she felt horrible bad pain in her head, blurred vision and lightheadedness.   If you want to change or add anything her pharmacy is [facility] in [place] and paper one in your box.

## 2018-09-07 NOTE — Telephone Encounter (Signed)
Left msg to call back.

## 2018-09-08 ENCOUNTER — Other Ambulatory Visit: Payer: Self-pay

## 2018-09-08 NOTE — Telephone Encounter (Signed)
Spoke with pt and given information. Pt verbalized understanding. Reassured her medication was ok during pregnancy as discussed at office visit.

## 2018-09-27 DIAGNOSIS — F3181 Bipolar II disorder: Secondary | ICD-10-CM | POA: Insufficient documentation

## 2018-10-11 ENCOUNTER — Ambulatory Visit: Payer: Self-pay | Admitting: Psychiatry

## 2018-11-01 ENCOUNTER — Other Ambulatory Visit: Payer: Self-pay

## 2018-11-01 DIAGNOSIS — F3163 Bipolar disorder, current episode mixed, severe, without psychotic features: Secondary | ICD-10-CM

## 2018-11-01 MED ORDER — LAMOTRIGINE 150 MG PO TABS: ORAL_TABLET | ORAL | 0 refills | Status: DC

## 2018-11-10 ENCOUNTER — Encounter: Payer: Medicaid Other | Admitting: Obstetrics and Gynecology

## 2018-11-15 ENCOUNTER — Encounter: Payer: Self-pay | Admitting: General Practice

## 2018-11-15 ENCOUNTER — Other Ambulatory Visit (HOSPITAL_COMMUNITY)
Admission: RE | Admit: 2018-11-15 | Discharge: 2018-11-15 | Disposition: A | Discharge status: Home / Self Care | Payer: Medicaid Other | Source: Ambulatory Visit | Attending: Obstetrics and Gynecology | Admitting: Obstetrics and Gynecology

## 2018-11-15 ENCOUNTER — Ambulatory Visit (INDEPENDENT_AMBULATORY_CARE_PROVIDER_SITE_OTHER): Payer: Medicaid Other | Admitting: *Deleted

## 2018-11-15 ENCOUNTER — Other Ambulatory Visit: Payer: Self-pay

## 2018-11-15 VITALS — BP 111/69 | HR 89 | Temp 98.3°F | Wt 148.0 lb

## 2018-11-15 DIAGNOSIS — O0990 Supervision of high risk pregnancy, unspecified, unspecified trimester: Secondary | ICD-10-CM | POA: Insufficient documentation

## 2018-11-15 DIAGNOSIS — Z3201 Encounter for pregnancy test, result positive: Secondary | ICD-10-CM

## 2018-11-15 DIAGNOSIS — Z3200 Encounter for pregnancy test, result unknown: Secondary | ICD-10-CM

## 2018-11-15 LAB — POCT URINALYSIS DIPSTICK OB
Bilirubin, UA: NEGATIVE
Glucose, UA: NEGATIVE
Ketones, UA: NEGATIVE
NITRITE UA: NEGATIVE
PH UA: 7 (ref 5.0–8.0)
RBC UA: NEGATIVE
Spec Grav, UA: 1.02 (ref 1.010–1.025)
UROBILINOGEN UA: 1 U/dL

## 2018-11-15 LAB — POCT URINE PREGNANCY: Preg Test, Ur: POSITIVE — AB

## 2018-11-15 NOTE — Assessment & Plan Note (Deleted)
  Nursing Staff Provider  Office Location  [facility] Dating  LMP 07/18/18  Language  English Anatomy US    Flu Vaccine  Declined Genetic Screen  NIPS:   AFP:   First Screen:  Quad:    TDaP vaccine    Hgb A1C or  GTT Early  Third trimester   Rhogam     LAB RESULTS   Feeding Plan Formula Blood Type     Contraception BTL Antibody    Circumcision If Boy, yes Rubella    Pediatrician  [facility]     Support Person [facility]     Prenatal Classes No HIV    BTL Consent  GBS  (For PCN allergy, check sensitivities)   VBAC Consent  Pap     Hgb Electro      CF     SMA     Waterbirth  [ ]  Class [ ]  Consent [ ]  CNM visit

## 2018-11-15 NOTE — Patient Instructions (Signed)
   Genetic Screening Results Information: You are having genetic testing called [facility] today.  It will take approximately 2 weeks before the results are available.  To get your results, you need Internet access to a web browser to search [place]/MyChart (the direct app on your phone will not give you these results).  Then select Lab Scanned and click on the blue hyper link that says View Image to see your [facility] results.  You can also use the directions on the purple card given to look up your results directly on the [facility] website.  

## 2018-11-15 NOTE — Progress Notes (Signed)
   PRENATAL INTAKE SUMMARY  Ms. Lisa Schmitt presents today New OB Nurse Interview.  OB History    Gravida  2   Para  1   Term  1   Preterm  0   AB  0   Living  1     SAB  0   TAB  0   Ectopic  0   Multiple  0   Live Births  1          I have reviewed the patient's medical, obstetrical, social, and family histories, medications, and available lab results.  SUBJECTIVE She complains of white vaginal discharge, itching and odor. Patient reported recent drug use of Methamphetamine about [redacted] weeks along with alcohol use. Current everyday cigarette smoker. History of Hep C, HSV and other mental disorders. Patient also requested Zofran for nausea. Patient stated that Phenergan did not help during last pregnancy.  OBJECTIVE Initial nurse interview for history and lab work (New OB).  Late to prenatal care.  Patient is not currently on any medications for Hep C or mental disorders. Patient stopped all medications.  EDD: 56/24/2020 by LMP GA: [id number]  GENERAL APPEARANCE: alert, well appearing, in no apparent distress, oriented to person, place and time, well hydrated   ASSESSMENT Normal pregnancy.  PLAN Prenatal care-[facility]. Per Raelyn Moraolitta Dawson, CNM patient should be seen by a MD due to medical history. OB Pnl/HIV  OB Urine Culture/dip GC/CT-self swab HgbEval SMA/CF ([facility]) [facility] Continue PNV Ultrasound >14 weeks ordered.  Genetic Screening Results Information: You are having genetic testing called [facility] today.  It will take approximately 2 weeks before the results are available.  To get your results, you need Internet access to a web browser to search [place]/MyChart (the direct app on your phone will not give you these results).  Then select Lab Scanned and click on the blue hyper link that says View Image to see your [facility] results.  You can also use the directions on the purple card given to look up your results directly on the [facility]  website.  Lisa Schmitt, Lisa L, RN

## 2018-11-16 LAB — CERVICOVAGINAL ANCILLARY ONLY
BACTERIAL VAGINITIS: NEGATIVE
CANDIDA VAGINITIS: POSITIVE — AB
Chlamydia: NEGATIVE
NEISSERIA GONORRHEA: NEGATIVE
TRICH (WINDOWPATH): POSITIVE — AB

## 2018-11-16 LAB — OBSTETRIC PANEL, INCLUDING HIV
Antibody Screen: NEGATIVE
BASOS ABS: 0 10*3/uL (ref 0.0–0.2)
Basos: 0 %
EOS (ABSOLUTE): 0.1 10*3/uL (ref 0.0–0.4)
Eos: 1 %
HIV Screen 4th Generation wRfx: NONREACTIVE
Hematocrit: 32.9 % — ABNORMAL LOW (ref 34.0–46.6)
Hemoglobin: 11.4 g/dL (ref 11.1–15.9)
Hepatitis B Surface Ag: NEGATIVE
IMMATURE GRANS (ABS): 0.1 10*3/uL (ref 0.0–0.1)
IMMATURE GRANULOCYTES: 1 %
LYMPHS: 17 %
Lymphocytes Absolute: 1.8 10*3/uL (ref 0.7–3.1)
MCH: 29.9 pg (ref 26.6–33.0)
MCHC: 34.7 g/dL (ref 31.5–35.7)
MCV: 86 fL (ref 79–97)
MONOCYTES: 4 %
Monocytes Absolute: 0.5 10*3/uL (ref 0.1–0.9)
Neutrophils Absolute: 8.1 10*3/uL — ABNORMAL HIGH (ref 1.4–7.0)
Neutrophils: 77 %
PLATELETS: 373 10*3/uL (ref 150–450)
RBC: 3.81 x10E6/uL (ref 3.77–5.28)
RDW: 11.7 % — AB (ref 12.3–15.4)
RPR Ser Ql: NONREACTIVE
RUBELLA: 3.79 {index} (ref >0.99)
Rh Factor: POSITIVE
WBC: 10.6 10*3/uL (ref 3.4–10.8)

## 2018-11-16 LAB — SICKLE CELL SCREEN: SICKLE CELL SCREEN: NEGATIVE

## 2018-11-17 LAB — CULTURE, OB URINE

## 2018-11-17 LAB — URINE CULTURE, OB REFLEX: Organism ID, Bacteria: NO GROWTH

## 2018-11-19 ENCOUNTER — Telehealth: Payer: Self-pay | Admitting: *Deleted

## 2018-11-19 DIAGNOSIS — B373 Candidiasis of vulva and vagina: Secondary | ICD-10-CM

## 2018-11-19 DIAGNOSIS — A599 Trichomoniasis, unspecified: Secondary | ICD-10-CM

## 2018-11-19 DIAGNOSIS — B3731 Acute candidiasis of vulva and vagina: Secondary | ICD-10-CM

## 2018-11-19 MED ORDER — METRONIDAZOLE 500 MG PO TABS: 2000.0000 mg | ORAL_TABLET | Freq: Once | ORAL | 0 refills | Status: AC

## 2018-11-19 MED ORDER — TERCONAZOLE 0.4 % VA CREA: 1.0000 | TOPICAL_CREAM | Freq: Every day | VAGINAL | 0 refills | Status: DC

## 2018-11-19 NOTE — Telephone Encounter (Signed)
Patient called and verified DOB. Pt advised of positive Trich and yeast infection. Metronidazole 2000 mg PO x 1 and Terazole yeast vaginal cream sent to patient's pharmacy.  Advised patient to come into clinic today for Rx for partner, however patient stated she could not come in today due to work schedule. Advised to have partner go to PCP or Health Department.  Clovis PuMartin, Meredeth Furber L, RN

## 2018-11-19 NOTE — Telephone Encounter (Signed)
-----   Message from Raelyn Moraolitta Dawson, [place] sent at 11/18/2018  9:24 AM EST ----- Please treat for yeast. Send Rx for Flagyl 2,000 mg at one time. Her partner will need to be treated for trich as well.

## 2018-11-22 ENCOUNTER — Encounter (HOSPITAL_COMMUNITY): Payer: Self-pay

## 2018-11-25 ENCOUNTER — Encounter: Payer: Self-pay | Admitting: General Practice

## 2018-11-29 ENCOUNTER — Ambulatory Visit (HOSPITAL_COMMUNITY)
Admission: RE | Admit: 2018-11-29 | Discharge: 2018-11-29 | Disposition: A | Discharge status: Home / Self Care | Payer: Medicaid Other | Source: Ambulatory Visit | Attending: Obstetrics and Gynecology | Admitting: Obstetrics and Gynecology

## 2018-11-29 ENCOUNTER — Encounter (HOSPITAL_COMMUNITY): Payer: Self-pay

## 2018-11-29 ENCOUNTER — Other Ambulatory Visit: Payer: Self-pay | Admitting: Obstetrics and Gynecology

## 2018-11-29 DIAGNOSIS — Z363 Encounter for antenatal screening for malformations: Secondary | ICD-10-CM

## 2018-11-29 DIAGNOSIS — O09292 Supervision of pregnancy with other poor reproductive or obstetric history, second trimester: Secondary | ICD-10-CM | POA: Diagnosis not present

## 2018-11-29 DIAGNOSIS — O98412 Viral hepatitis complicating pregnancy, second trimester: Secondary | ICD-10-CM | POA: Diagnosis not present

## 2018-11-29 DIAGNOSIS — O0990 Supervision of high risk pregnancy, unspecified, unspecified trimester: Secondary | ICD-10-CM

## 2018-11-29 DIAGNOSIS — O99322 Drug use complicating pregnancy, second trimester: Secondary | ICD-10-CM | POA: Insufficient documentation

## 2018-11-29 DIAGNOSIS — Z3A19 19 weeks gestation of pregnancy: Secondary | ICD-10-CM

## 2018-11-29 HISTORY — DX: Herpesviral infection, unspecified: B00.9

## 2018-11-30 ENCOUNTER — Other Ambulatory Visit (HOSPITAL_COMMUNITY): Payer: Self-pay | Admitting: *Deleted

## 2018-11-30 DIAGNOSIS — Z362 Encounter for other antenatal screening follow-up: Secondary | ICD-10-CM

## 2018-12-03 ENCOUNTER — Encounter: Payer: Self-pay | Admitting: Obstetrics & Gynecology

## 2018-12-03 ENCOUNTER — Ambulatory Visit (INDEPENDENT_AMBULATORY_CARE_PROVIDER_SITE_OTHER): Payer: Medicaid Other | Admitting: Obstetrics & Gynecology

## 2018-12-03 ENCOUNTER — Other Ambulatory Visit (HOSPITAL_COMMUNITY)
Admission: RE | Admit: 2018-12-03 | Discharge: 2018-12-03 | Disposition: A | Discharge status: Home / Self Care | Payer: Medicaid Other | Source: Ambulatory Visit | Attending: Obstetrics & Gynecology | Admitting: Obstetrics & Gynecology

## 2018-12-03 VITALS — BP 115/72 | HR 105 | Wt 150.0 lb

## 2018-12-03 DIAGNOSIS — O34219 Maternal care for unspecified type scar from previous cesarean delivery: Secondary | ICD-10-CM | POA: Insufficient documentation

## 2018-12-03 DIAGNOSIS — O219 Vomiting of pregnancy, unspecified: Secondary | ICD-10-CM

## 2018-12-03 DIAGNOSIS — Z3482 Encounter for supervision of other normal pregnancy, second trimester: Secondary | ICD-10-CM | POA: Diagnosis not present

## 2018-12-03 DIAGNOSIS — O99322 Drug use complicating pregnancy, second trimester: Secondary | ICD-10-CM | POA: Insufficient documentation

## 2018-12-03 DIAGNOSIS — O0990 Supervision of high risk pregnancy, unspecified, unspecified trimester: Secondary | ICD-10-CM

## 2018-12-03 MED ORDER — PROMETHAZINE HCL 25 MG PO TABS
25.0000 mg | ORAL_TABLET | Freq: Four times a day (QID) | ORAL | 2 refills | Status: DC | PRN
Start: 2018-12-03 — End: 2019-02-07

## 2018-12-03 NOTE — Progress Notes (Signed)
  Subjective:    Lisa Schmitt is being seen today for her first obstetrical visit.  This is not a planned pregnancy. She is at [redacted]w[redacted]d gestation. Her obstetrical history is significant for previous cesarean section, heroin use, previous Subutex. Relationship with FOB: significant other, living together. Patient does intend to breast feed. Pregnancy history fully reviewed.  Patient reports nausea.  Review of Systems:   Review of Systems  Gastrointestinal: Positive for nausea and vomiting.  Genitourinary: Negative for dysuria and vaginal bleeding.    Objective:     BP 115/72   Pulse (!) 105   Wt 150 lb (68 kg)   LMP 07/18/2018 (Exact Date)   BMI 27.44 kg/m  Physical Exam  Vitals reviewed. Constitutional: She is oriented to person, place, and time. She appears well-developed. No distress.  Respiratory: Effort normal and breath sounds normal. No respiratory distress.  GI: Soft. There is no abdominal tenderness.  Genitourinary:    Vagina normal.     No vaginal discharge.   Neurological: She is alert and oriented to person, place, and time.  Skin: Skin is warm and dry.    Maternal Exam:  Introitus: Vagina is negative for discharge.   Breasts: breasts appear normal, no suspicious masses, no skin or nipple changes or axillary nodes.    Assessment:    Pregnancy: G2P1001 Patient Active Problem List   Diagnosis Date Noted  . H/O cesarean section complicating pregnancy 12/03/2018  . Bipolar II disorder (HCC) 09/27/2018  . Bipolar I disorder, most recent episode depressed (HCC) 08/06/2018  . Generalized anxiety disorder 08/06/2018  . 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 Supervision of high risk pregnancy, antepartum 04/24/2016  . Chronic hepatitis C affecting pregnancy, antepartum (HCC) 03/27/2016  . History of breast abscess 03/27/2016  . History of depression 03/27/2016  .  Nausea and vomiting during pregnancy 03/27/2016  . Scar of abdominal wall 03/22/2012       Plan:     Initial labs drawn. Prenatal vitamins. Problem list reviewed and updated. AFP3 discussed: ordered. Role of ultrasound in pregnancy discussed; fetal survey: results reviewed. Amniocentesis discussed: not indicated. Follow up in 4 weeks. 50% of 30 min visit spent on counseling and coordination of care.  Referral to integrated behavioral health   Scheryl Darter 12/03/2018

## 2018-12-03 NOTE — Patient Instructions (Signed)

## 2018-12-03 NOTE — Progress Notes (Signed)
Pt states she needs help due to recent  relapse on x-mas. + TRIC on 11/15/18

## 2018-12-05 LAB — AFP, SERUM, OPEN SPINA BIFIDA
AFP MoM: 0.98
AFP Value: 51.5 ng/mL
GEST. AGE ON COLLECTION DATE: 19.5 wk
Maternal Age At EDD: 24.8 yr
OSBR RISK 1 IN: 10000
TEST RESULTS AFP: NEGATIVE
Weight: 150 [lb_av]

## 2018-12-05 LAB — HEPATITIS C ANTIBODY

## 2018-12-06 ENCOUNTER — Ambulatory Visit (INDEPENDENT_AMBULATORY_CARE_PROVIDER_SITE_OTHER): Payer: Medicaid Other | Admitting: Clinical

## 2018-12-06 DIAGNOSIS — F3160 Bipolar disorder, current episode mixed, unspecified: Secondary | ICD-10-CM

## 2018-12-06 DIAGNOSIS — O99322 Drug use complicating pregnancy, second trimester: Secondary | ICD-10-CM

## 2018-12-06 NOTE — BH Specialist Note (Signed)
Integrated Behavioral Health Initial Visit  MRN: [id number] Name: Lisa Schmitt  Number of Integrated Behavioral Health Clinician visits:: 1/6 Session Start time: 10:18Session End time: 10:48 Total time: 30 minutes  Type of Service: Integrated Behavioral Health- Individual/Family Interpretor:No. Interpretor Name and Language: n/a   Warm Hand Off Completed.       SUBJECTIVE: Lisa Schmitt is a 23 y.o. female accompanied by Friend Patient was referred by Scheryl Darter, MD for substance abuse affecting pregnancy . Patient reports the following symptoms/concerns: Pt states her primary concern today is relapsing during pregnancy with methamphetamine and alcohol, triggered by stressful events; pt has had an increase in panic attacks and muscle tension during pregnancy. Duration of problem: Increase in pregnancy; Severity of problem: moderate  OBJECTIVE: Mood: Anxious and Affect: Appropriate Risk of harm to self or others: No plan to harm self or others  LIFE CONTEXT: Family and Social: Pt lives on grandmothers property; family reducing support after recent relapse School/Work: - Self-Care: Guided meditation at night Life Changes: Current pregnancy; substance relapse  GOALS ADDRESSED: Patient will: 1. Reduce symptoms of: anxiety, depression and stress 2. Increase knowledge and/or ability of: healthy habits and self-management skills  3. Demonstrate ability to: Increase healthy adjustment to current life circumstances, Increase motivation to adhere to plan of care and Decrease self-medicating behaviors  INTERVENTIONS: Interventions utilized: Mindfulness or Management consultant and Psychoeducation and/or Health Education  Standardized Assessments completed: GAD-7 and PHQ 9  ASSESSMENT: Patient currently experiencing Bipolar affective disorder, and Substance use affecting pregnancy, PER PREVIOUS DIAGNOSIS   Patient may benefit from psychoeducation and brief therapeutic  interventions regarding coping with underlying symptoms of anxiety with panic attacks and depression, along with consult on substance treatment options   PLAN: 1. Follow up with behavioral health clinician on : As needed 2. Behavioral recommendations:  -Establish care with substance treatment agency of choice, as discussed in office visit -Download [facility] app on phone, as additional support -CALM relaxation breathing exercise twice daily (morning; at bedtime) -Read educational materials regarding coping with symptoms of anxiety with panic attacks and depression -Use calming apps as additional self-coping strategy -Continue prioritizing healthy sleep daily  3. Referral(s): Integrated Art gallery manager (In Clinic) and Substance Abuse Program 4. "From scale of 1-10, how likely are you to follow plan?": 9  Lisa Schmitt Lisa Depp, LCSW  Depression screen [facility] 2/9 12/06/2018  Decreased Interest 1  Down, Depressed, Hopeless 2  PHQ - 2 Score 3  Altered sleeping 3  Tired, decreased energy 3  Change in appetite 2  Feeling bad or failure about yourself  3  Trouble concentrating 3  Moving slowly or fidgety/restless 1  Suicidal thoughts 0  PHQ-9 Score 18   GAD 7 : Generalized Anxiety Score 12/06/2018  Nervous, Anxious, on Edge 3  Control/stop worrying 3  Worry too much - different things 2  Trouble relaxing 2  Restless 2  Easily annoyed or irritable 3  Afraid - awful might happen 1  Total GAD 7 Score 16

## 2018-12-08 LAB — CYTOLOGY - PAP
Diagnosis: NEGATIVE
HPV (WINDOPATH): DETECTED — AB

## 2018-12-09 ENCOUNTER — Encounter: Payer: Medicaid Other | Admitting: Obstetrics and Gynecology

## 2018-12-09 ENCOUNTER — Telehealth: Payer: Self-pay | Admitting: Clinical

## 2018-12-09 NOTE — Telephone Encounter (Signed)
[facility], [address], [place], [place], on December 14, 2018, at 12pm. She will be required to attend twice/week to receive Subutex at this location.   Pt prefers to be managed with Subutex by her OB/GYN, because of transportation barriers, but agrees to continue with [facility] until she is able to talk to her medical provider at her next medical appointment on December 30, 2018 @ [facility].

## 2018-12-28 ENCOUNTER — Ambulatory Visit (HOSPITAL_COMMUNITY)
Admission: RE | Admit: 2018-12-28 | Discharge: 2018-12-28 | Disposition: A | Discharge status: Home / Self Care | Payer: Medicaid Other | Source: Ambulatory Visit | Attending: Obstetrics and Gynecology | Admitting: Obstetrics and Gynecology

## 2018-12-28 ENCOUNTER — Institutional Professional Consult (permissible substitution): Payer: Self-pay

## 2018-12-28 ENCOUNTER — Encounter (HOSPITAL_COMMUNITY): Payer: Self-pay

## 2018-12-28 DIAGNOSIS — Z3A23 23 weeks gestation of pregnancy: Secondary | ICD-10-CM

## 2018-12-28 DIAGNOSIS — Z362 Encounter for other antenatal screening follow-up: Secondary | ICD-10-CM | POA: Diagnosis not present

## 2018-12-28 DIAGNOSIS — O09292 Supervision of pregnancy with other poor reproductive or obstetric history, second trimester: Secondary | ICD-10-CM

## 2018-12-28 DIAGNOSIS — O0932 Supervision of pregnancy with insufficient antenatal care, second trimester: Secondary | ICD-10-CM | POA: Diagnosis not present

## 2018-12-28 DIAGNOSIS — O99322 Drug use complicating pregnancy, second trimester: Secondary | ICD-10-CM | POA: Insufficient documentation

## 2018-12-28 DIAGNOSIS — O98412 Viral hepatitis complicating pregnancy, second trimester: Secondary | ICD-10-CM

## 2018-12-28 DIAGNOSIS — Z363 Encounter for antenatal screening for malformations: Secondary | ICD-10-CM | POA: Diagnosis not present

## 2018-12-29 ENCOUNTER — Ambulatory Visit (INDEPENDENT_AMBULATORY_CARE_PROVIDER_SITE_OTHER): Payer: Medicaid Other | Admitting: Obstetrics and Gynecology

## 2018-12-29 ENCOUNTER — Other Ambulatory Visit (HOSPITAL_COMMUNITY): Payer: Self-pay | Admitting: *Deleted

## 2018-12-29 VITALS — BP 107/72 | HR 102 | Wt 148.0 lb

## 2018-12-29 DIAGNOSIS — O0992 Supervision of high risk pregnancy, unspecified, second trimester: Secondary | ICD-10-CM

## 2018-12-29 DIAGNOSIS — O99322 Drug use complicating pregnancy, second trimester: Secondary | ICD-10-CM

## 2018-12-29 DIAGNOSIS — O98419 Viral hepatitis complicating pregnancy, unspecified trimester: Principal | ICD-10-CM

## 2018-12-29 DIAGNOSIS — O98412 Viral hepatitis complicating pregnancy, second trimester: Secondary | ICD-10-CM

## 2018-12-29 DIAGNOSIS — O34219 Maternal care for unspecified type scar from previous cesarean delivery: Secondary | ICD-10-CM

## 2018-12-29 DIAGNOSIS — Z8619 Personal history of other infectious and parasitic diseases: Secondary | ICD-10-CM

## 2018-12-29 DIAGNOSIS — O0990 Supervision of high risk pregnancy, unspecified, unspecified trimester: Secondary | ICD-10-CM

## 2018-12-29 DIAGNOSIS — O99320 Drug use complicating pregnancy, unspecified trimester: Principal | ICD-10-CM

## 2018-12-29 DIAGNOSIS — B029 Zoster without complications: Secondary | ICD-10-CM | POA: Insufficient documentation

## 2018-12-29 DIAGNOSIS — B182 Chronic viral hepatitis C: Secondary | ICD-10-CM

## 2018-12-29 DIAGNOSIS — F1120 Opioid dependence, uncomplicated: Secondary | ICD-10-CM

## 2018-12-29 MED ORDER — VALACYCLOVIR HCL 1 G PO TABS: 1000.0000 mg | ORAL_TABLET | Freq: Two times a day (BID) | ORAL | 0 refills | Status: AC

## 2018-12-29 NOTE — Telephone Encounter (Signed)
Lisa Schmitt , I saw [name] today. Here current Subutex dose is listed in her meds Doing well. I discussed importance of taking and keeping her OB appts while in treatment Also discussed NICU tour  @ 32 weeks Thanks Casimiro Needle

## 2018-12-29 NOTE — Patient Instructions (Signed)

## 2018-12-29 NOTE — Progress Notes (Signed)
CC: Rash on leg x 1 week states itching pt describes rash as having " Blisters" .

## 2018-12-29 NOTE — Progress Notes (Signed)
Subjective:  Lisa Schmitt is a 23 y.o. G2P1001 at [redacted]w[redacted]d being seen today for ongoing prenatal care.  She is currently monitored for the following issues for this high-risk pregnancy and has Scar of abdominal wall; Chronic hepatitis C affecting pregnancy, antepartum (HCC); History of breast abscess; History of depression; Nausea and vomiting during pregnancy; Supervision of high risk pregnancy, antepartum; Substance abuse affecting pregnancy in second trimester, antepartum; Bipolar I disorder, most recent episode depressed (HCC); Generalized anxiety disorder; Bipolar II disorder (HCC); H/O cesarean section complicating pregnancy; History of ELISA positive for HSV; and Shingles rash on their problem list.  Patient reports painful blister like rash near right knee x 1 week. H/O shingles in the past..  Contractions: Not present. Vag. Bleeding: None.  Movement: Present. Denies leaking of fluid.   The following portions of the patient's history were reviewed and updated as appropriate: allergies, current medications, past family history, past medical history, past social history, past surgical history and problem list. Problem list updated.  Objective:   Vitals:   12/29/18 1318  BP: 107/72  Pulse: (!) 102  Weight: 148 lb (67.1 kg)    Fetal Status: Fetal Heart Rate (bpm): 150   Movement: Present     General:  Alert, oriented and cooperative. Patient is in no acute distress.  Skin: Skin is warm and dry. No rash noted.   Cardiovascular: Normal heart rate noted  Respiratory: Normal respiratory effort, no problems with respiration noted  Abdomen: Soft, gravid, appropriate for gestational age. Pain/Pressure: Absent     Pelvic:  Cervical exam deferred        Extremities: Normal range of motion.  Edema: None  Mental Status: Normal mood and affect. Normal behavior. Normal judgment and thought content.   Urinalysis:      Assessment and Plan:  Pregnancy: G2P1001 at [redacted]w[redacted]d  1. Supervision of high  risk pregnancy, antepartum Stable Glucola next visit. Growth scan next month  2. Chronic hepatitis C affecting pregnancy, antepartum (HCC) Stable  3. 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 Discussed with pt. Dr Jolayne Panther has agreed to prescribe. Stressed importance of taking medication and keeping OB appts  Will schedule NICU tour @ 32 weeks  4. H/O cesarean section complicating pregnancy Desires repeat Consent signed today Also desires BTL To sign papers at next visit  5. History of ELISA positive for HSV Prophylactics at 36 weeks. - 6. Herpes zoster without complication Valtrex   Preterm labor symptoms and general obstetric precautions including but not limited to vaginal bleeding, contractions, leaking of fluid and fetal movement were reviewed in detail with the patient. Please refer to After Visit Summary for other counseling recommendations.  Return in about 4 weeks (around 01/26/2019) for OB visit.   Hermina Staggers, MD

## 2018-12-30 ENCOUNTER — Encounter: Payer: Medicaid Other | Admitting: Obstetrics and Gynecology

## 2018-12-30 NOTE — Telephone Encounter (Signed)
Yes, she needs refill. No refills and has enough medication for today. Thanks Casimiro NeedleMichael

## 2018-12-31 ENCOUNTER — Telehealth: Payer: Self-pay

## 2018-12-31 NOTE — Telephone Encounter (Signed)
Contacted pharmacy about pt subutex, per provider request.

## 2019-01-01 ENCOUNTER — Other Ambulatory Visit: Payer: Self-pay | Admitting: Obstetrics and Gynecology

## 2019-01-01 MED ORDER — BUPRENORPHINE HCL-NALOXONE HCL 8-2 MG SL SUBL: 1.0000 | SUBLINGUAL_TABLET | Freq: Two times a day (BID) | SUBLINGUAL | 0 refills | Status: DC

## 2019-01-03 ENCOUNTER — Telehealth: Payer: Self-pay

## 2019-01-03 NOTE — Telephone Encounter (Signed)
Pt states Rx sent she cannot take due to having naloxone in it pt states it needs to be Subutex 8MG  without Naloxone   Please advise.

## 2019-01-10 ENCOUNTER — Other Ambulatory Visit: Payer: Self-pay | Admitting: Obstetrics and Gynecology

## 2019-01-10 MED ORDER — BUPRENORPHINE HCL-NALOXONE HCL 8-2 MG SL SUBL: 1.0000 | SUBLINGUAL_TABLET | Freq: Two times a day (BID) | SUBLINGUAL | 0 refills | Status: DC

## 2019-01-25 ENCOUNTER — Other Ambulatory Visit: Payer: Self-pay

## 2019-01-25 MED ORDER — BUPRENORPHINE HCL-NALOXONE HCL 8-2 MG SL SUBL: 1.0000 | SUBLINGUAL_TABLET | Freq: Two times a day (BID) | SUBLINGUAL | 0 refills | Status: DC

## 2019-01-25 NOTE — Telephone Encounter (Signed)
Patient sent email requesting medication refill on Buprenorphine (Subutex)  - pharmacy verified.

## 2019-01-26 ENCOUNTER — Ambulatory Visit (INDEPENDENT_AMBULATORY_CARE_PROVIDER_SITE_OTHER): Payer: Medicaid Other | Admitting: Obstetrics and Gynecology

## 2019-01-26 ENCOUNTER — Encounter: Payer: Self-pay | Admitting: Obstetrics and Gynecology

## 2019-01-26 ENCOUNTER — Other Ambulatory Visit: Payer: Medicaid Other

## 2019-01-26 VITALS — BP 118/77 | HR 89 | Wt 154.0 lb

## 2019-01-26 DIAGNOSIS — Z23 Encounter for immunization: Secondary | ICD-10-CM | POA: Diagnosis not present

## 2019-01-26 DIAGNOSIS — B182 Chronic viral hepatitis C: Secondary | ICD-10-CM

## 2019-01-26 DIAGNOSIS — Z3009 Encounter for other general counseling and advice on contraception: Secondary | ICD-10-CM

## 2019-01-26 DIAGNOSIS — Z8619 Personal history of other infectious and parasitic diseases: Secondary | ICD-10-CM

## 2019-01-26 DIAGNOSIS — O0992 Supervision of high risk pregnancy, unspecified, second trimester: Secondary | ICD-10-CM

## 2019-01-26 DIAGNOSIS — O34219 Maternal care for unspecified type scar from previous cesarean delivery: Secondary | ICD-10-CM

## 2019-01-26 DIAGNOSIS — Z3A27 27 weeks gestation of pregnancy: Secondary | ICD-10-CM

## 2019-01-26 DIAGNOSIS — O98419 Viral hepatitis complicating pregnancy, unspecified trimester: Secondary | ICD-10-CM

## 2019-01-26 DIAGNOSIS — O0990 Supervision of high risk pregnancy, unspecified, unspecified trimester: Secondary | ICD-10-CM

## 2019-01-26 DIAGNOSIS — O98412 Viral hepatitis complicating pregnancy, second trimester: Secondary | ICD-10-CM

## 2019-01-26 DIAGNOSIS — O99322 Drug use complicating pregnancy, second trimester: Secondary | ICD-10-CM

## 2019-01-26 NOTE — Progress Notes (Signed)
Subjective:  Lisa Schmitt is a 23 y.o. G2P1001 at 56w3dbeing seen today for ongoing prenatal care.  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  Patient reports general discomforts of pregnancy.  Contractions: Not present. Vag. Bleeding: None.  Movement: Present. Denies leaking of fluid.   The following portions of the patient's history were reviewed and updated as appropriate: allergies, current medications, past family history, past medical history, past social history, past surgical history and problem list. Problem list updated.  Objective:   Vitals:   01/26/19 0825  BP: 118/77  Pulse: 89  Weight: 154 lb (69.9 kg)    Fetal Status: Fetal Heart Rate (bpm): 150   Movement: Present     General:  Alert, oriented and cooperative. Patient is in no acute distress.  Skin: Skin is warm and dry. No rash noted.   Cardiovascular: Normal heart rate noted  Respiratory: Normal respiratory effort, no problems with respiration noted  Abdomen: Soft, gravid, appropriate for gestational age. Pain/Pressure: Absent     Pelvic:  Cervical exam deferred        Extremities: Normal range of motion.     Mental Status: Normal mood and affect. Normal behavior. Normal judgment and thought content.   Urinalysis:      Assessment and Plan:  Pregnancy: G2P1001 at 259w3d1. Supervision of high risk pregnancy, antepartum Stable 28 week  labs today - Glucose Tolerance, 2 Hours w/1 Hour - HIV Antibody (routine testing w rflx) - RPR - CBC  2. 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 Chronic hepatitis C affecting pregnancy, antepartum (HCC) Stable - Comp Met (CMET)  4. H/O cesarean section complicating pregnancy Desires repeat at 39 weeks  5. History of ELISA positive for HSV   6. Unwanted fertility BTL papers today  Preterm labor symptoms and general obstetric precautions including but not limited to vaginal bleeding, contractions, leaking of fluid and fetal movement were reviewed in detail with the patient. Please refer to After Visit Summary for other counseling recommendations.  Return in about 3 weeks (around 02/16/2019) for OB visit.   ErChancy MilroyMD

## 2019-01-26 NOTE — Patient Instructions (Signed)
Third Trimester of Pregnancy The third trimester is from week 28 through week 40 (months 7 through 9). The third trimester is a time when the unborn baby (fetus) is growing rapidly. At the end of the ninth month, the fetus is about 20 inches in length and weighs 6-10 pounds. Body changes during your third trimester Your body will continue to go through many changes during pregnancy. The changes vary from woman to woman. During the third trimester:  Your weight will continue to increase. You can expect to gain 25-35 pounds (11-16 kg) by the end of the pregnancy.  You may begin to get stretch marks on your hips, abdomen, and breasts.  You may urinate more often because the fetus is moving lower into your pelvis and pressing on your bladder.  You may develop or continue to have heartburn. This is caused by increased hormones that slow down muscles in the digestive tract.  You may develop or continue to have constipation because increased hormones slow digestion and cause the muscles that push waste through your intestines to relax.  You may develop hemorrhoids. These are swollen veins (varicose veins) in the rectum that can itch or be painful.  You may develop swollen, bulging veins (varicose veins) in your legs.  You may have increased body aches in the pelvis, back, or thighs. This is due to weight gain and increased hormones that are relaxing your joints.  You may have changes in your hair. These can include thickening of your hair, rapid growth, and changes in texture. Some women also have hair loss during or after pregnancy, or hair that feels dry or thin. Your hair will most likely return to normal after your baby is born.  Your breasts will continue to grow and they will continue to become tender. A yellow fluid (colostrum) may leak from your breasts. This is the first milk you are producing for your baby.  Your belly button may stick out.  You may notice more swelling in your hands,  face, or ankles.  You may have increased tingling or numbness in your hands, arms, and legs. The skin on your belly may also feel numb.  You may feel short of breath because of your expanding uterus.  You may have more problems sleeping. This can be caused by the size of your belly, increased need to urinate, and an increase in your body's metabolism.  You may notice the fetus "dropping," or moving lower in your abdomen (lightening).  You may have increased vaginal discharge.  You may notice your joints feel loose and you may have pain around your pelvic bone. What to expect at prenatal visits You will have prenatal exams every 2 weeks until week 36. Then you will have weekly prenatal exams. During a routine prenatal visit:  You will be weighed to make sure you and the baby are growing normally.  Your blood pressure will be taken.  Your abdomen will be measured to track your baby's growth.  The fetal heartbeat will be listened to.  Any test results from the previous visit will be discussed.  You may have a cervical check near your due date to see if your cervix has softened or thinned (effaced).  You will be tested for Group B streptococcus. This happens between 35 and 37 weeks. Your health care provider may ask you:  What your birth plan is.  How you are feeling.  If you are feeling the baby move.  If you have had any abnormal   symptoms, such as leaking fluid, bleeding, severe headaches, or abdominal cramping.  If you are using any tobacco products, including cigarettes, chewing tobacco, and electronic cigarettes.  If you have any questions. Other tests or screenings that may be performed during your third trimester include:  Blood tests that check for low iron levels (anemia).  Fetal testing to check the health, activity level, and growth of the fetus. Testing is done if you have certain medical conditions or if there are problems during the pregnancy.  Nonstress test  (NST). This test checks the health of your baby to make sure there are no signs of problems, such as the baby not getting enough oxygen. During this test, a belt is placed around your belly. The baby is made to move, and its heart rate is monitored during movement. What is false labor? False labor is a condition in which you feel small, irregular tightenings of the muscles in the womb (contractions) that usually go away with rest, changing position, or drinking water. These are called Braxton Hicks contractions. Contractions may last for hours, days, or even weeks before true labor sets in. If contractions come at regular intervals, become more frequent, increase in intensity, or become painful, you should see your health care provider. What are the signs of labor?  Abdominal cramps.  Regular contractions that start at 10 minutes apart and become stronger and more frequent with time.  Contractions that start on the top of the uterus and spread down to the lower abdomen and back.  Increased pelvic pressure and dull back pain.  A watery or bloody mucus discharge that comes from the vagina.  Leaking of amniotic fluid. This is also known as your "water breaking." It could be a slow trickle or a gush. Let your health care provider know if it has a color or strange odor. If you have any of these signs, call your health care provider right away, even if it is before your due date. Follow these instructions at home: Medicines  Follow your health care provider's instructions regarding medicine use. Specific medicines may be either safe or unsafe to take during pregnancy.  Take a prenatal vitamin that contains at least 600 micrograms (mcg) of folic acid.  If you develop constipation, try taking a stool softener if your health care provider approves. Eating and drinking   Eat a balanced diet that includes fresh fruits and vegetables, whole grains, good sources of protein such as meat, eggs, or tofu,  and low-fat dairy. Your health care provider will help you determine the amount of weight gain that is right for you.  Avoid raw meat and uncooked cheese. These carry germs that can cause birth defects in the baby.  If you have low calcium intake from food, talk to your health care provider about whether you should take a daily calcium supplement.  Eat four or five small meals rather than three large meals a day.  Limit foods that are high in fat and processed sugars, such as fried and sweet foods.  To prevent constipation: ? Drink enough fluid to keep your urine clear or pale yellow. ? Eat foods that are high in fiber, such as fresh fruits and vegetables, whole grains, and beans. Activity  Exercise only as directed by your health care provider. Most women can continue their usual exercise routine during pregnancy. Try to exercise for 30 minutes at least 5 days a week. Stop exercising if you experience uterine contractions.  Avoid heavy lifting.  Do   not exercise in extreme heat or humidity, or at high altitudes.  Wear low-heel, comfortable shoes.  Practice good posture.  You may continue to have sex unless your health care provider tells you otherwise. Relieving pain and discomfort  Take frequent breaks and rest with your legs elevated if you have leg cramps or low back pain.  Take warm sitz baths to soothe any pain or discomfort caused by hemorrhoids. Use hemorrhoid cream if your health care provider approves.  Wear a good support bra to prevent discomfort from breast tenderness.  If you develop varicose veins: ? Wear support pantyhose or compression stockings as told by your healthcare provider. ? Elevate your feet for 15 minutes, 3-4 times a day. Prenatal care  Write down your questions. Take them to your prenatal visits.  Keep all your prenatal visits as told by your health care provider. This is important. Safety  Wear your seat belt at all times when driving.  Make  a list of emergency phone numbers, including numbers for family, friends, the hospital, and police and fire departments. General instructions  Avoid cat litter boxes and soil used by cats. These carry germs that can cause birth defects in the baby. If you have a cat, ask someone to clean the litter box for you.  Do not travel far distances unless it is absolutely necessary and only with the approval of your health care provider.  Do not use hot tubs, steam rooms, or saunas.  Do not drink alcohol.  Do not use any products that contain nicotine or tobacco, such as cigarettes and e-cigarettes. If you need help quitting, ask your health care provider.  Do not use any medicinal herbs or unprescribed drugs. These chemicals affect the formation and growth of the baby.  Do not douche or use tampons or scented sanitary pads.  Do not cross your legs for long periods of time.  To prepare for the arrival of your baby: ? Take prenatal classes to understand, practice, and ask questions about labor and delivery. ? Make a trial run to the hospital. ? Visit the hospital and tour the maternity area. ? Arrange for maternity or paternity leave through employers. ? Arrange for family and friends to take care of pets while you are in the hospital. ? Purchase a rear-facing car seat and make sure you know how to install it in your car. ? Pack your hospital bag. ? Prepare the baby's nursery. Make sure to remove all pillows and stuffed animals from the baby's crib to prevent suffocation.  Visit your dentist if you have not gone during your pregnancy. Use a soft toothbrush to brush your teeth and be gentle when you floss. Contact a health care provider if:  You are unsure if you are in labor or if your water has broken.  You become dizzy.  You have mild pelvic cramps, pelvic pressure, or nagging pain in your abdominal area.  You have lower back pain.  You have persistent nausea, vomiting, or  diarrhea.  You have an unusual or bad smelling vaginal discharge.  You have pain when you urinate. Get help right away if:  Your water breaks before 37 weeks.  You have regular contractions less than 5 minutes apart before 37 weeks.  You have a fever.  You are leaking fluid from your vagina.  You have spotting or bleeding from your vagina.  You have severe abdominal pain or cramping.  You have rapid weight loss or weight gain.  You have   shortness of breath with chest pain.  You notice sudden or extreme swelling of your face, hands, ankles, feet, or legs.  Your baby makes fewer than 10 movements in 2 hours.  You have severe headaches that do not go away when you take medicine.  You have vision changes. Summary  The third trimester is from week 28 through week 40, months 7 through 9. The third trimester is a time when the unborn baby (fetus) is growing rapidly.  During the third trimester, your discomfort may increase as you and your baby continue to gain weight. You may have abdominal, leg, and back pain, sleeping problems, and an increased need to urinate.  During the third trimester your breasts will keep growing and they will continue to become tender. A yellow fluid (colostrum) may leak from your breasts. This is the first milk you are producing for your baby.  False labor is a condition in which you feel small, irregular tightenings of the muscles in the womb (contractions) that eventually go away. These are called Braxton Hicks contractions. Contractions may last for hours, days, or even weeks before true labor sets in.  Signs of labor can include: abdominal cramps; regular contractions that start at 10 minutes apart and become stronger and more frequent with time; watery or bloody mucus discharge that comes from the vagina; increased pelvic pressure and dull back pain; and leaking of amniotic fluid. This information is not intended to replace advice given to you by your  health care provider. Make sure you discuss any questions you have with your health care provider. Document Released: 11/11/2001 Document Revised: 12/23/2016 Document Reviewed: 12/23/2016 Elsevier Interactive Patient Education  2019 [employer]  

## 2019-01-27 LAB — CBC
Hematocrit: 34.4 % (ref 34.0–46.6)
Hemoglobin: 11.3 g/dL (ref 11.1–15.9)
MCH: 29.5 pg (ref 26.6–33.0)
MCHC: 32.8 g/dL (ref 31.5–35.7)
MCV: 90 fL (ref 79–97)
Platelets: 320 10*3/uL (ref 150–450)
RBC: 3.83 x10E6/uL (ref 3.77–5.28)
RDW: 12.5 % (ref 11.7–15.4)
WBC: 11 10*3/uL — ABNORMAL HIGH (ref 3.4–10.8)

## 2019-01-27 LAB — COMPREHENSIVE METABOLIC PANEL
ALK PHOS: 60 IU/L (ref 39–117)
ALT: 7 IU/L (ref 0–32)
AST: 12 IU/L (ref 0–40)
Albumin/Globulin Ratio: 1.3 (ref 1.2–2.2)
Albumin: 3.6 g/dL — ABNORMAL LOW (ref 3.9–5.0)
BUN/Creatinine Ratio: 14 (ref 9–23)
BUN: 7 mg/dL (ref 6–20)
CO2: 22 mmol/L (ref 20–29)
Calcium: 8.8 mg/dL (ref 8.7–10.2)
Chloride: 101 mmol/L (ref 96–106)
Creatinine, Ser: 0.49 mg/dL — ABNORMAL LOW (ref 0.57–1.00)
GFR calc Af Amer: 158 mL/min/{1.73_m2} (ref >59)
GFR calc non Af Amer: 137 mL/min/{1.73_m2} (ref >59)
Globulin, Total: 2.8 g/dL (ref 1.5–4.5)
Glucose: 72 mg/dL (ref 65–99)
Potassium: 4.7 mmol/L (ref 3.5–5.2)
Sodium: 138 mmol/L (ref 134–144)
Total Protein: 6.4 g/dL (ref 6.0–8.5)

## 2019-01-27 LAB — RPR: RPR Ser Ql: NONREACTIVE

## 2019-01-27 LAB — HIV ANTIBODY (ROUTINE TESTING W REFLEX): HIV Screen 4th Generation wRfx: NONREACTIVE

## 2019-01-27 LAB — GLUCOSE TOLERANCE, 2 HOURS W/ 1HR
GLUCOSE, FASTING: 73 mg/dL (ref 65–91)
Glucose, 1 hour: 103 mg/dL (ref 65–179)
Glucose, 2 hour: 70 mg/dL (ref 65–152)

## 2019-01-28 ENCOUNTER — Other Ambulatory Visit: Payer: Self-pay | Admitting: *Deleted

## 2019-01-28 ENCOUNTER — Ambulatory Visit (HOSPITAL_COMMUNITY)
Admission: RE | Admit: 2019-01-28 | Discharge: 2019-01-28 | Disposition: A | Discharge status: Home / Self Care | Payer: Medicaid Other | Source: Ambulatory Visit | Attending: Obstetrics and Gynecology | Admitting: Obstetrics and Gynecology

## 2019-01-28 ENCOUNTER — Ambulatory Visit (HOSPITAL_COMMUNITY): Payer: Medicaid Other | Admitting: *Deleted

## 2019-01-28 ENCOUNTER — Encounter (HOSPITAL_COMMUNITY): Payer: Self-pay | Admitting: *Deleted

## 2019-01-28 ENCOUNTER — Other Ambulatory Visit (HOSPITAL_COMMUNITY): Payer: Self-pay | Admitting: *Deleted

## 2019-01-28 DIAGNOSIS — O99320 Drug use complicating pregnancy, unspecified trimester: Secondary | ICD-10-CM | POA: Insufficient documentation

## 2019-01-28 DIAGNOSIS — O99322 Drug use complicating pregnancy, second trimester: Secondary | ICD-10-CM

## 2019-01-28 DIAGNOSIS — O98412 Viral hepatitis complicating pregnancy, second trimester: Secondary | ICD-10-CM

## 2019-01-28 DIAGNOSIS — F1120 Opioid dependence, uncomplicated: Secondary | ICD-10-CM

## 2019-01-28 DIAGNOSIS — O09292 Supervision of pregnancy with other poor reproductive or obstetric history, second trimester: Secondary | ICD-10-CM

## 2019-01-28 DIAGNOSIS — B182 Chronic viral hepatitis C: Secondary | ICD-10-CM | POA: Diagnosis not present

## 2019-01-28 DIAGNOSIS — Z3A27 27 weeks gestation of pregnancy: Secondary | ICD-10-CM

## 2019-01-28 DIAGNOSIS — O0932 Supervision of pregnancy with insufficient antenatal care, second trimester: Secondary | ICD-10-CM | POA: Diagnosis not present

## 2019-01-28 DIAGNOSIS — O0990 Supervision of high risk pregnancy, unspecified, unspecified trimester: Secondary | ICD-10-CM

## 2019-01-28 MED ORDER — PRENATE PIXIE 10-0.6-0.4-200 MG PO CAPS: 1.0000 | ORAL_CAPSULE | Freq: Every day | ORAL | 11 refills | Status: AC

## 2019-01-28 NOTE — Progress Notes (Unsigned)
.  mfm

## 2019-02-07 ENCOUNTER — Other Ambulatory Visit: Payer: Self-pay

## 2019-02-07 DIAGNOSIS — O0990 Supervision of high risk pregnancy, unspecified, unspecified trimester: Secondary | ICD-10-CM

## 2019-02-07 MED ORDER — PROMETHAZINE HCL 25 MG PO TABS: 25.0000 mg | ORAL_TABLET | Freq: Four times a day (QID) | ORAL | 2 refills | Status: DC | PRN

## 2019-02-07 NOTE — Progress Notes (Signed)
Refill rx promethazine

## 2019-02-10 ENCOUNTER — Other Ambulatory Visit: Payer: Self-pay | Admitting: Obstetrics and Gynecology

## 2019-02-10 MED ORDER — BUPRENORPHINE HCL-NALOXONE HCL 8-2 MG SL SUBL: 1.0000 | SUBLINGUAL_TABLET | Freq: Two times a day (BID) | SUBLINGUAL | 0 refills | Status: AC

## 2019-02-15 ENCOUNTER — Encounter: Payer: Self-pay | Admitting: Obstetrics and Gynecology

## 2019-02-15 ENCOUNTER — Other Ambulatory Visit: Payer: Self-pay | Admitting: Obstetrics and Gynecology

## 2019-02-15 MED ORDER — BUPRENORPHINE HCL-NALOXONE HCL 8-2 MG SL FILM: 1.0000 | ORAL_FILM | Freq: Two times a day (BID) | SUBLINGUAL | 0 refills | Status: DC

## 2019-02-16 ENCOUNTER — Telehealth: Payer: Self-pay

## 2019-02-16 ENCOUNTER — Encounter: Payer: Self-pay | Admitting: Obstetrics and Gynecology

## 2019-02-16 ENCOUNTER — Other Ambulatory Visit: Payer: Self-pay

## 2019-02-16 ENCOUNTER — Ambulatory Visit (INDEPENDENT_AMBULATORY_CARE_PROVIDER_SITE_OTHER): Payer: Medicaid Other | Admitting: Obstetrics and Gynecology

## 2019-02-16 VITALS — BP 117/75 | HR 101 | Wt 156.3 lb

## 2019-02-16 DIAGNOSIS — Z3A30 30 weeks gestation of pregnancy: Secondary | ICD-10-CM

## 2019-02-16 DIAGNOSIS — B182 Chronic viral hepatitis C: Secondary | ICD-10-CM

## 2019-02-16 DIAGNOSIS — O99322 Drug use complicating pregnancy, second trimester: Secondary | ICD-10-CM

## 2019-02-16 DIAGNOSIS — O34219 Maternal care for unspecified type scar from previous cesarean delivery: Secondary | ICD-10-CM

## 2019-02-16 DIAGNOSIS — O0990 Supervision of high risk pregnancy, unspecified, unspecified trimester: Secondary | ICD-10-CM

## 2019-02-16 DIAGNOSIS — O98419 Viral hepatitis complicating pregnancy, unspecified trimester: Secondary | ICD-10-CM

## 2019-02-16 NOTE — Telephone Encounter (Signed)
Pt sent My Chart message stating Suboxone was not approved per pharmacy. We informed pharmacy yesterday that PA was approved. TC to pt to initiate conference call with the pharmacy.   Pt said never mind the pharmacy has her medication ready for pick up.

## 2019-02-16 NOTE — Progress Notes (Signed)
Pt is here for ROB. [redacted]w[redacted]d. Pt reports mild headaches for about a week, pt states HA goes away with tylenol

## 2019-02-16 NOTE — Progress Notes (Signed)
   PRENATAL VISIT NOTE  Subjective:  Lisa Schmitt is a 23 y.o. G2P1001 at [redacted]w[redacted]d being seen today for ongoing prenatal care.  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  Patient reports mild headache relieved with tylenol.  Contractions: Irritability. Vag. Bleeding: None.  Movement: Present. Denies leaking of fluid.   The following portions of the patient's history were reviewed and updated as appropriate: allergies, current medications, past family history, past medical history, past social history, past surgical history and problem list.   Objective:   Vitals:   02/16/19 1427  BP: 117/75  Pulse: (!) 101  Weight: 156 lb 4.8 oz (70.9 kg)    Fetal Status: Fetal Heart Rate (bpm): 145 Fundal Height: 30 cm Movement: Present     General:  Alert, oriented and cooperative. Patient is in no acute distress.  Skin: Skin is warm and dry. No rash noted.   Cardiovascular: Normal heart rate noted  Respiratory: Normal respiratory effort, no problems with respiration noted  Abdomen: Soft, gravid, appropriate for gestational age.  Pain/Pressure: Absent     Pelvic: Cervical exam deferred        Extremities: Normal range of motion.  Edema: None  Mental Status: Normal mood and affect. Normal behavior. Normal judgment and thought content.   Assessment and Plan:  Pregnancy: G2P1001 at [redacted]w[redacted]d 1. Supervision of high risk  pregnancy, antepartum Patient is doing well Advised patient to remain well hydrated Patient with daughter with cardiac malformation- fetal echo ordered Follow up growth ultrasound next week - US Fetal Echocardiography; Future  2. H/O cesarean section complicating pregnancy Patient desires repeat with BTL Information submitted to surgical scheduler  3. 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 Chronic hepatitis C affecting pregnancy, antepartum (HCC) Stable LFTs normal 2/26  Preterm labor symptoms and general obstetric precautions including but not limited to vaginal bleeding, contractions, leaking of fluid and fetal movement were reviewed in detail with the patient. Please refer to After Visit Summary for other counseling recommendations.   No follow-ups on file.  Future Appointments  Date Time Provider Department Center  02/25/2019  2:00 PM [facility] NURSE [facility]  02/25/2019  2:15 PM [facility] [place] [facility]    Catalina Antigua, MD

## 2019-02-21 DIAGNOSIS — Z8619 Personal history of other infectious and parasitic diseases: Secondary | ICD-10-CM

## 2019-02-22 MED ORDER — VALACYCLOVIR HCL 500 MG PO TABS: ORAL_TABLET | ORAL | 5 refills | Status: AC

## 2019-02-22 NOTE — Telephone Encounter (Signed)
Valtrex prescription for treatment and suppression has been provided.

## 2019-02-25 ENCOUNTER — Ambulatory Visit (HOSPITAL_COMMUNITY): Payer: Self-pay

## 2019-02-25 ENCOUNTER — Ambulatory Visit (HOSPITAL_COMMUNITY): Payer: Medicaid Other

## 2019-02-28 ENCOUNTER — Other Ambulatory Visit: Payer: Self-pay | Admitting: Obstetrics & Gynecology

## 2019-02-28 MED ORDER — BUPRENORPHINE HCL-NALOXONE HCL 8-2 MG SL FILM: 1.0000 | ORAL_FILM | Freq: Two times a day (BID) | SUBLINGUAL | 0 refills | Status: DC

## 2019-02-28 NOTE — Progress Notes (Signed)
Orders for surgery 

## 2019-03-02 ENCOUNTER — Encounter: Payer: Self-pay | Admitting: Obstetrics and Gynecology

## 2019-03-02 ENCOUNTER — Other Ambulatory Visit: Payer: Self-pay

## 2019-03-02 ENCOUNTER — Ambulatory Visit (INDEPENDENT_AMBULATORY_CARE_PROVIDER_SITE_OTHER): Payer: Medicaid Other | Admitting: Obstetrics and Gynecology

## 2019-03-02 DIAGNOSIS — O34219 Maternal care for unspecified type scar from previous cesarean delivery: Secondary | ICD-10-CM | POA: Diagnosis not present

## 2019-03-02 DIAGNOSIS — O98413 Viral hepatitis complicating pregnancy, third trimester: Secondary | ICD-10-CM

## 2019-03-02 DIAGNOSIS — O99322 Drug use complicating pregnancy, second trimester: Secondary | ICD-10-CM

## 2019-03-02 DIAGNOSIS — Z3A32 32 weeks gestation of pregnancy: Secondary | ICD-10-CM

## 2019-03-02 DIAGNOSIS — O98419 Viral hepatitis complicating pregnancy, unspecified trimester: Secondary | ICD-10-CM

## 2019-03-02 DIAGNOSIS — Z3009 Encounter for other general counseling and advice on contraception: Secondary | ICD-10-CM

## 2019-03-02 DIAGNOSIS — B182 Chronic viral hepatitis C: Secondary | ICD-10-CM | POA: Diagnosis not present

## 2019-03-02 DIAGNOSIS — O99323 Drug use complicating pregnancy, third trimester: Secondary | ICD-10-CM | POA: Diagnosis not present

## 2019-03-02 DIAGNOSIS — O0990 Supervision of high risk pregnancy, unspecified, unspecified trimester: Secondary | ICD-10-CM

## 2019-03-02 DIAGNOSIS — Z8619 Personal history of other infectious and parasitic diseases: Secondary | ICD-10-CM

## 2019-03-02 MED ORDER — CALAMINE EX LOTN: 1.0000 "application " | TOPICAL_LOTION | CUTANEOUS | 0 refills | Status: DC | PRN

## 2019-03-02 MED ORDER — NICOTINE 14 MG/24HR TD PT24: 14.0000 mg | MEDICATED_PATCH | Freq: Every day | TRANSDERMAL | 0 refills | Status: DC

## 2019-03-02 NOTE — Progress Notes (Signed)
   TELEHEALTH VIRTUAL OBSTETRICS VISIT ENCOUNTER NOTE  I connected with NORLENE HODGKIN on 03/02/19 at  3:00 PM EDT by telephone at home and verified that I am speaking with the correct person using two identifiers.   I discussed the limitations, risks, security and privacy concerns of performing an evaluation and management service by telephone and the availability of in person appointments. I also discussed with the patient that there may be a patient responsible charge related to this service. The patient expressed understanding and agreed to proceed.  Subjective:  Lisa Schmitt is a 23 y.o. G2P1001 at [redacted]w[redacted]d being followed for ongoing prenatal care.  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  Patient reports exposure poison ivy and the presence of pruritic rash. Reports fetal movement. Denies any contractions, bleeding or leaking of fluid.   The following portions of the patient's history were reviewed and updated as appropriate: allergies, current medications, past family history, past medical history, past social history, past surgical history and problem list.   Objective:   General:  Alert, oriented and cooperative.   Mental Status: Normal mood and affect perceived. Normal judgment and thought content.  Rest of physical exam deferred due to type of encounter  Assessment and Plan:  Pregnancy: G2P1001 at [redacted]w[redacted]d  1. Supervision of high risk pregnancy, antepartum Patient is doing well Rx calamine lotion provided to help with pruritic rash Patient is requesting rx for nicotine patch which was provided  2. H/O cesarean section complicating pregnancy Scheduled for repeat cesarean section  Patient understands that given the covid19 pandemic her next visit will likely be another virtual visit Patient is scheduled for a growth ultrasound on 4/17  3. 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 Chronic hepatitis C affecting pregnancy, antepartum (HCC) Stable  5. History of ELISA positive for HSV Continue supression  6. Unwanted fertility Patient scheduled for repeat c-section with BTL on 04/18/19  Preterm labor symptoms and general obstetric precautions including but not limited to vaginal bleeding, contractions, leaking of fluid and fetal movement were reviewed in detail with the patient.  I discussed the assessment and treatment plan with the patient. The patient was provided an opportunity to ask questions and all were answered. The patient agreed with the plan and demonstrated an understanding of the instructions. The patient was advised to call back or seek an in-person office evaluation/go to MAU at [facility] for any urgent or concerning symptoms. Please refer to After Visit Summary for other counseling recommendations.   I provided 15 minutes of non-face-to-face time during this encounter.  Return in about 2 weeks (around 03/16/2019) for TELEHEALTH: ROB.  Future Appointments  Date Time Provider Department Center  03/18/2019  2:45 PM WH-MFC NURSE WH-MFC MFC-US  03/18/2019  2:45 PM WH-MFC [place] 2 WH-MFCUS MFC-US    Catalina Antigua, MD Center for [employer], [facility]

## 2019-03-02 NOTE — Progress Notes (Signed)
Pt states she has bad case of Poison ivy on hand and right leg, pt has been using hydrocortisone with no relief.  Pt questions wether she could use Nicotine patches while pregnant to help with smoking cessation.  Pt currently smokes about 1 pack per day.

## 2019-03-14 MED ORDER — BUPRENORPHINE HCL-NALOXONE HCL 8-2 MG SL FILM: 1.0000 | ORAL_FILM | Freq: Two times a day (BID) | SUBLINGUAL | 0 refills | Status: DC

## 2019-03-18 ENCOUNTER — Encounter (HOSPITAL_COMMUNITY): Payer: Self-pay

## 2019-03-18 ENCOUNTER — Ambulatory Visit (HOSPITAL_COMMUNITY): Payer: Medicaid Other | Admitting: *Deleted

## 2019-03-18 ENCOUNTER — Ambulatory Visit (INDEPENDENT_AMBULATORY_CARE_PROVIDER_SITE_OTHER): Payer: Medicaid Other | Admitting: Obstetrics & Gynecology

## 2019-03-18 ENCOUNTER — Other Ambulatory Visit: Payer: Self-pay

## 2019-03-18 ENCOUNTER — Ambulatory Visit (HOSPITAL_COMMUNITY)
Admission: RE | Admit: 2019-03-18 | Discharge: 2019-03-18 | Disposition: A | Discharge status: Home / Self Care | Payer: Medicaid Other | Source: Ambulatory Visit | Attending: Maternal & Fetal Medicine | Admitting: Maternal & Fetal Medicine

## 2019-03-18 VITALS — BP 115/72 | HR 83 | Temp 97.9°F | Wt 162.1 lb

## 2019-03-18 DIAGNOSIS — O0933 Supervision of pregnancy with insufficient antenatal care, third trimester: Secondary | ICD-10-CM

## 2019-03-18 DIAGNOSIS — O98413 Viral hepatitis complicating pregnancy, third trimester: Secondary | ICD-10-CM | POA: Diagnosis not present

## 2019-03-18 DIAGNOSIS — Z362 Encounter for other antenatal screening follow-up: Secondary | ICD-10-CM

## 2019-03-18 DIAGNOSIS — O98419 Viral hepatitis complicating pregnancy, unspecified trimester: Principal | ICD-10-CM

## 2019-03-18 DIAGNOSIS — O99323 Drug use complicating pregnancy, third trimester: Secondary | ICD-10-CM | POA: Diagnosis not present

## 2019-03-18 DIAGNOSIS — F1120 Opioid dependence, uncomplicated: Secondary | ICD-10-CM | POA: Diagnosis present

## 2019-03-18 DIAGNOSIS — O99320 Drug use complicating pregnancy, unspecified trimester: Secondary | ICD-10-CM | POA: Insufficient documentation

## 2019-03-18 DIAGNOSIS — O99322 Drug use complicating pregnancy, second trimester: Secondary | ICD-10-CM | POA: Diagnosis present

## 2019-03-18 DIAGNOSIS — B182 Chronic viral hepatitis C: Secondary | ICD-10-CM

## 2019-03-18 DIAGNOSIS — Z3A34 34 weeks gestation of pregnancy: Secondary | ICD-10-CM

## 2019-03-18 DIAGNOSIS — Z3009 Encounter for other general counseling and advice on contraception: Secondary | ICD-10-CM

## 2019-03-18 DIAGNOSIS — O34219 Maternal care for unspecified type scar from previous cesarean delivery: Secondary | ICD-10-CM

## 2019-03-18 DIAGNOSIS — Z8619 Personal history of other infectious and parasitic diseases: Secondary | ICD-10-CM

## 2019-03-18 DIAGNOSIS — O09293 Supervision of pregnancy with other poor reproductive or obstetric history, third trimester: Secondary | ICD-10-CM

## 2019-03-18 DIAGNOSIS — O0990 Supervision of high risk pregnancy, unspecified, unspecified trimester: Secondary | ICD-10-CM

## 2019-03-18 NOTE — Progress Notes (Signed)
S/w pt via tele visit, pt reports fetal movement with some uterine irritability.

## 2019-03-18 NOTE — Progress Notes (Signed)
   TELEHEALTH VIRTUAL OBSTETRICS VISIT ENCOUNTER NOTE  I connected with Lisa Schmitt on 03/18/19 at 10:10 AM EDT by telephone at home and verified that I am speaking with the correct person using two identifiers.   I discussed the limitations, risks, security and privacy concerns of performing an evaluation and management service by telephone and the availability of in person appointments. I also discussed with the patient that there may be a patient responsible charge related to this service. The patient expressed understanding and agreed to proceed.  Subjective:  Lisa Schmitt is a 23 y.o. G2P1001 at [redacted]w[redacted]d being followed for ongoing prenatal care.  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  Patient reports no complaints. Reports fetal movement. Denies any contractions, bleeding or leaking of fluid.   The following portions of the patient's history were reviewed and updated as appropriate: allergies, current medications, past family history, past medical history, past social history, past surgical history and problem list.   Objective:   General:  Alert, oriented and cooperative.   Mental Status: Normal mood and affect perceived. Normal judgment and thought content.  Rest of physical exam deferred due to type of encounter  Assessment and Plan:  Pregnancy: G2P1001 at [redacted]w[redacted]d 1. Chronic hepatitis C affecting  pregnancy, antepartum (HCC) - stable  2. Supervision of high risk pregnancy, antepartum - Babyscripts Schedule Optimization - she has a scale at home and knows to weigh herself weekly  3. 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 H/O cesarean section complicating pregnancy - repeat with BTL scheduled 04/18/19  5. History of ELISA positive for HSV - on Valtex  6. Unwanted fertility - plans BTL  Preterm labor symptoms and general obstetric precautions including but not limited to vaginal bleeding, contractions, leaking of fluid and fetal movement were reviewed in detail with the patient.  I discussed the assessment and treatment plan with the patient. The patient was provided an opportunity to ask questions and all were answered. The patient agreed with the plan and demonstrated an understanding of the instructions. The patient was advised to call back or seek an in-person office evaluation/go to MAU at [facility] for any urgent or concerning symptoms. Please refer to After Visit Summary for other counseling recommendations.   I provided 8 minutes of non-face-to-face time during this encounter.  No follow-ups on file.  Future Appointments  Date Time Provider Department Center  03/18/2019  2:45 PM WH-MFC NURSE WH-MFC MFC-US  03/18/2019  2:45 PM WH-MFC [place] 2 WH-MFCUS MFC-US    Allie Bossier, MD Center for [employer], [facility]

## 2019-03-25 ENCOUNTER — Encounter: Payer: Self-pay | Admitting: Obstetrics and Gynecology

## 2019-03-25 ENCOUNTER — Ambulatory Visit (INDEPENDENT_AMBULATORY_CARE_PROVIDER_SITE_OTHER): Payer: Medicaid Other | Admitting: Obstetrics and Gynecology

## 2019-03-25 ENCOUNTER — Other Ambulatory Visit: Payer: Self-pay

## 2019-03-25 ENCOUNTER — Other Ambulatory Visit (HOSPITAL_COMMUNITY)
Admission: RE | Admit: 2019-03-25 | Discharge: 2019-03-25 | Disposition: A | Discharge status: Home / Self Care | Payer: Medicaid Other | Source: Ambulatory Visit | Attending: Obstetrics and Gynecology | Admitting: Obstetrics and Gynecology

## 2019-03-25 VITALS — BP 113/71 | HR 93 | Temp 97.1°F | Wt 166.2 lb

## 2019-03-25 DIAGNOSIS — O34219 Maternal care for unspecified type scar from previous cesarean delivery: Secondary | ICD-10-CM

## 2019-03-25 DIAGNOSIS — O0990 Supervision of high risk pregnancy, unspecified, unspecified trimester: Secondary | ICD-10-CM

## 2019-03-25 DIAGNOSIS — O98413 Viral hepatitis complicating pregnancy, third trimester: Secondary | ICD-10-CM

## 2019-03-25 DIAGNOSIS — Z3A35 35 weeks gestation of pregnancy: Secondary | ICD-10-CM

## 2019-03-25 DIAGNOSIS — O99323 Drug use complicating pregnancy, third trimester: Secondary | ICD-10-CM

## 2019-03-25 DIAGNOSIS — O99322 Drug use complicating pregnancy, second trimester: Secondary | ICD-10-CM

## 2019-03-25 DIAGNOSIS — B182 Chronic viral hepatitis C: Secondary | ICD-10-CM

## 2019-03-25 DIAGNOSIS — O98419 Viral hepatitis complicating pregnancy, unspecified trimester: Secondary | ICD-10-CM

## 2019-03-25 DIAGNOSIS — Z3009 Encounter for other general counseling and advice on contraception: Secondary | ICD-10-CM

## 2019-03-25 NOTE — Progress Notes (Signed)
Patient reports fetal movement with irregular contractions. Pt reports for the last 3 weeks some stomach cramps that feel like possible diarrhea, but nothing happens.

## 2019-03-25 NOTE — Progress Notes (Signed)
Subjective:  Lisa Schmitt is a 23 y.o. G2P1001 at [redacted]w[redacted]d being seen today for ongoing prenatal care.  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  Patient reports general discomforts of pregnancy.  Contractions: Irregular. Vag. Bleeding: None.  Movement: Present. Denies leaking of fluid.   The following portions of the patient's history were reviewed and updated as appropriate: allergies, current medications, past family history, past medical history, past social history, past surgical history and problem list. Problem list updated.  Objective:   Vitals:   03/25/19 1105  BP: 113/71  Pulse: 93  Temp: (!) 97.1 F (36.2 C)  Weight: 166 lb 3.2 oz (75.4 kg)    Fetal Status: Fetal Heart Rate (bpm): 152   Movement: Present     General:  Alert, oriented and cooperative. Patient is in no acute distress.  Skin: Skin is warm and dry. No rash noted.   Cardiovascular: Normal heart rate noted  Respiratory: Normal respiratory effort, no problems with respiration noted  Abdomen: Soft, gravid, appropriate for gestational age. Pain/Pressure: Absent     Pelvic:  Cervical exam performed        Extremities: Normal range of motion.  Edema: None  Mental Status: Normal mood and affect. Normal behavior. Normal judgment and thought content.   Urinalysis:      Assessment and Plan:  Pregnancy: G2P1001 at [redacted]w[redacted]d  1. Supervision of high risk  pregnancy, antepartum Stable Vaginal cultures obtained   2. H/O cesarean section complicating pregnancy For repeat at 39 weeks  3. Unwanted fertility BTL with c section  4. Chronic hepatitis C affecting pregnancy, antepartum (HCC) Stable   5. 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 Please refer to After Visit Summary for other counseling recommendations.  Return in about 2 weeks (around 04/08/2019) for OB visit, televisit.   Hermina Staggers, MD

## 2019-03-27 LAB — STREP GP B NAA: Strep Gp B NAA: NEGATIVE

## 2019-03-29 LAB — CERVICOVAGINAL ANCILLARY ONLY
Chlamydia: NEGATIVE
Neisseria Gonorrhea: NEGATIVE

## 2019-03-29 MED ORDER — BUPRENORPHINE HCL-NALOXONE HCL 8-2 MG SL FILM: 1.0000 | ORAL_FILM | Freq: Two times a day (BID) | SUBLINGUAL | 0 refills | Status: AC

## 2019-04-07 ENCOUNTER — Encounter (HOSPITAL_COMMUNITY): Payer: Self-pay | Admitting: *Deleted

## 2019-04-07 NOTE — Patient Instructions (Addendum)
[name]  04/07/2019   Your procedure is scheduled on:  04/18/2019  Arrive at 0730 at [employer] [facility] on [address] at [facility]  and [address]. You are invited to use the FREE valet parking or use the Visitor's parking deck.  Pick up the phone at the desk and dial [phone].  Call this number if you have problems the morning of surgery: [phone]  Remember:   Do not eat food:(After Midnight) Desps de medianoche.  Do not drink clear liquids: (After Midnight) Desps de medianoche.  Take these medicines the morning of surgery with A SIP OF WATER:  suboxone and valtrex   Do not wear jewelry, make-up or nail polish.  Do not wear lotions, powders, or perfumes. Do not wear deodorant.  Do not shave 48 hours prior to surgery.  Do not bring valuables to the hospital.  [facility] is not   responsible for any belongings or valuables brought to the hospital.  Contacts, dentures or bridgework may not be worn into surgery.  Leave suitcase in the car. After surgery it may be brought to your room.  For patients admitted to the hospital, checkout time is 11:00 AM the day of              discharge.      Please read over the following fact sheets that you were given:     Preparing for Surgery

## 2019-04-08 ENCOUNTER — Ambulatory Visit (INDEPENDENT_AMBULATORY_CARE_PROVIDER_SITE_OTHER): Payer: Medicaid Other | Admitting: Obstetrics and Gynecology

## 2019-04-08 ENCOUNTER — Encounter: Payer: Self-pay | Admitting: Obstetrics and Gynecology

## 2019-04-08 ENCOUNTER — Other Ambulatory Visit: Payer: Self-pay

## 2019-04-08 DIAGNOSIS — O98419 Viral hepatitis complicating pregnancy, unspecified trimester: Secondary | ICD-10-CM

## 2019-04-08 DIAGNOSIS — O98413 Viral hepatitis complicating pregnancy, third trimester: Secondary | ICD-10-CM

## 2019-04-08 DIAGNOSIS — B182 Chronic viral hepatitis C: Secondary | ICD-10-CM

## 2019-04-08 DIAGNOSIS — O99323 Drug use complicating pregnancy, third trimester: Secondary | ICD-10-CM

## 2019-04-08 DIAGNOSIS — O0990 Supervision of high risk pregnancy, unspecified, unspecified trimester: Secondary | ICD-10-CM

## 2019-04-08 DIAGNOSIS — Z8619 Personal history of other infectious and parasitic diseases: Secondary | ICD-10-CM

## 2019-04-08 DIAGNOSIS — O99322 Drug use complicating pregnancy, second trimester: Secondary | ICD-10-CM

## 2019-04-08 DIAGNOSIS — O0993 Supervision of high risk pregnancy, unspecified, third trimester: Secondary | ICD-10-CM

## 2019-04-08 DIAGNOSIS — O34219 Maternal care for unspecified type scar from previous cesarean delivery: Secondary | ICD-10-CM

## 2019-04-08 DIAGNOSIS — Z3009 Encounter for other general counseling and advice on contraception: Secondary | ICD-10-CM

## 2019-04-08 NOTE — Progress Notes (Signed)
   TELEHEALTH VIRTUAL OBSTETRICS PRENATAL VISIT ENCOUNTER NOTE  I connected with Lisa Schmitt on 04/08/19 at 11:00 AM EDT by WebEx at home and verified that I am speaking with the correct person using two identifiers.   I discussed the limitations, risks, security and privacy concerns of performing an evaluation and management service by telephone and the availability of in person appointments. I also discussed with the patient that there may be a patient responsible charge related to this service. The patient expressed understanding and agreed to proceed. Subjective:  Lisa Schmitt is a 23 y.o. G2P1001 at [redacted]w[redacted]d being seen today for ongoing prenatal care.  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  Patient reports occasional contractions.  Reports fetal movement. Contractions: Irritability. Vag. Bleeding: None.  Movement: Present. Denies any contractions, bleeding or leaking of fluid.   The following portions of the patient's history were reviewed and updated as appropriate: allergies, current medications, past family history, past medical history, past social history, past surgical history and problem list.   Objective:  There were no vitals filed for this visit.  Fetal Status:     Movement: Present     General:  Alert, oriented and cooperative. Patient is in no acute distress.  Respiratory: Normal respiratory effort, no  problems with respiration noted  Mental Status: Normal mood and affect. Normal behavior. Normal judgment and thought content.  Rest of physical exam deferred due to type of encounter  Assessment and Plan:  Pregnancy: G2P1001 at [redacted]w[redacted]d  1. Supervision of high risk pregnancy, antepartum babyscripts has been ordered, she has cuff at home 111/70 two days ago in babyscripts  2. H/O cesarean section complicating pregnancy Has RCS+BTL scheduled  3. History of ELISA positive for HSV Cont valtrex  4. Unwanted fertility For BTL  5. Chronic hepatitis C affecting pregnancy, antepartum (HCC)  6. 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 I discussed the assessment and treatment plan with the patient. The patient was provided an opportunity to ask questions and all were answered. The patient agreed with the plan and demonstrated an understanding of the instructions. The patient was advised to call back or seek an in-person office evaluation/go to MAU at [facility] for any urgent or concerning symptoms. Please refer to After Visit Summary for other counseling recommendations.   I provided 12 minutes of face-to-face via WebEx time during this encounter.  Return in about 1 week (around 04/15/2019) for OB visit (MD), virtual.  Future Appointments  Date Time Provider Department Center  04/14/2019  8:30 AM MC-MAU 1 MC-INDC None    Conan Bowens, MD Center for [facility], [facility]

## 2019-04-08 NOTE — Progress Notes (Signed)
Pt presents for webex visit. Pt identified with two pt identifiers. She is [redacted]w[redacted]d. Pt has been checking her bp and entering them into the babyrx app. Last checked 5/6. Pt has no concerns.

## 2019-04-14 ENCOUNTER — Encounter: Payer: Self-pay | Admitting: Obstetrics and Gynecology

## 2019-04-14 ENCOUNTER — Other Ambulatory Visit: Payer: Self-pay

## 2019-04-14 ENCOUNTER — Other Ambulatory Visit (HOSPITAL_COMMUNITY)
Admission: RE | Admit: 2019-04-14 | Discharge: 2019-04-14 | Disposition: A | Discharge status: Home / Self Care | Payer: Medicaid Other | Source: Ambulatory Visit | Attending: Obstetrics and Gynecology | Admitting: Obstetrics and Gynecology

## 2019-04-14 ENCOUNTER — Ambulatory Visit (INDEPENDENT_AMBULATORY_CARE_PROVIDER_SITE_OTHER): Payer: Medicaid Other | Admitting: Obstetrics and Gynecology

## 2019-04-14 VITALS — BP 116/85 | HR 80

## 2019-04-14 DIAGNOSIS — O34219 Maternal care for unspecified type scar from previous cesarean delivery: Secondary | ICD-10-CM

## 2019-04-14 DIAGNOSIS — Z01812 Encounter for preprocedural laboratory examination: Secondary | ICD-10-CM | POA: Insufficient documentation

## 2019-04-14 DIAGNOSIS — Z3A38 38 weeks gestation of pregnancy: Secondary | ICD-10-CM

## 2019-04-14 DIAGNOSIS — Z3009 Encounter for other general counseling and advice on contraception: Secondary | ICD-10-CM

## 2019-04-14 DIAGNOSIS — Z1159 Encounter for screening for other viral diseases: Secondary | ICD-10-CM | POA: Diagnosis not present

## 2019-04-14 DIAGNOSIS — O0993 Supervision of high risk pregnancy, unspecified, third trimester: Secondary | ICD-10-CM

## 2019-04-14 DIAGNOSIS — O99323 Drug use complicating pregnancy, third trimester: Secondary | ICD-10-CM

## 2019-04-14 DIAGNOSIS — O99322 Drug use complicating pregnancy, second trimester: Secondary | ICD-10-CM

## 2019-04-14 DIAGNOSIS — O0990 Supervision of high risk pregnancy, unspecified, unspecified trimester: Secondary | ICD-10-CM

## 2019-04-14 NOTE — Progress Notes (Signed)
   TELEHEALTH VIRTUAL OBSTETRICS PRENATAL VISIT ENCOUNTER NOTE  I connected with ARSEMA WERTMAN on 04/14/19 at  2:00 PM EDT by WebEx at home and verified that I am speaking with the correct person using two identifiers.   I discussed the limitations, risks, security and privacy concerns of performing an evaluation and management service by telephone and the availability of in person appointments. I also discussed with the patient that there may be a patient responsible charge related to this service. The patient expressed understanding and agreed to proceed. Subjective:  Lisa Schmitt is a 23 y.o. G2P1001 at [redacted]w[redacted]d being seen today for ongoing prenatal care.  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  Patient reports general discomforts of pregnancy.  Reports fetal movement. Contractions: Irritability. Vag. Bleeding: None.  Movement: Present. Denies any contractions, bleeding or leaking of fluid.   The following portions of the patient's history were reviewed and updated as appropriate: allergies, current medications, past family history, past medical history, past social history, past surgical history and problem list.   Objective:   Vitals:   04/14/19 1416  BP: 116/85  Pulse: 80    Fetal Status:     Movement: Present     General:  Alert, oriented and cooperative. Patient is in no acute distress.  Respiratory: Normal  respiratory effort, no problems with respiration noted  Mental Status: Normal mood and affect. Normal behavior. Normal judgment and thought content.  Rest of physical exam deferred due to type of encounter  Assessment and Plan:  Pregnancy: G2P1001 at [redacted]w[redacted]d 1. Supervision of high risk pregnancy, antepartum Stable Labor precautions  2. H/O cesarean section complicating pregnancy For repeat with BTL 04/18/19  3. 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 Unwanted fertility For BTL  Term labor symptoms and general obstetric precautions including but not limited to vaginal bleeding, contractions, leaking of fluid and fetal movement were reviewed in detail with the patient. I discussed the assessment and treatment plan with the patient. The patient was provided an opportunity to ask questions and all were answered. The patient agreed with the plan and demonstrated an understanding of the instructions. The patient was advised to call back or seek an in-person office evaluation/go to MAU at [facility] for any urgent or concerning symptoms. Please refer to After Visit Summary for other counseling recommendations.   I provided 11 minutes of face-to-face via WebEx time during this encounter.  Return for 1 week incision check from 04/18/19, face to face and in 4 weeks for PP visit, televisit.  No future appointments.  Hermina Staggers, MD Center for [facility], [facility]

## 2019-04-14 NOTE — Progress Notes (Signed)
Pt presents for webex visit. Pt identified with two pt identifiers. She is currently [redacted]w[redacted]d. Her bp today is 116/85

## 2019-04-14 NOTE — MAU Note (Signed)
Covid swab collected. Pt tolerated well. PT asymptomatic 

## 2019-04-15 LAB — NOVEL CORONAVIRUS, NAA (HOSP ORDER, SEND-OUT TO REF LAB; TAT 18-24 HRS): SARS-CoV-2, NAA: NOT DETECTED

## 2019-04-18 ENCOUNTER — Inpatient Hospital Stay (HOSPITAL_COMMUNITY): Payer: Medicaid Other | Admitting: Certified Registered Nurse Anesthetist

## 2019-04-18 ENCOUNTER — Encounter (HOSPITAL_COMMUNITY)
Admission: RE | Disposition: A | Discharge status: Home / Self Care | Payer: Self-pay | Source: Home / Self Care | Attending: Obstetrics & Gynecology

## 2019-04-18 ENCOUNTER — Other Ambulatory Visit: Payer: Self-pay

## 2019-04-18 ENCOUNTER — Other Ambulatory Visit: Payer: Self-pay | Admitting: Obstetrics and Gynecology

## 2019-04-18 ENCOUNTER — Encounter (HOSPITAL_COMMUNITY): Payer: Self-pay | Admitting: *Deleted

## 2019-04-18 ENCOUNTER — Inpatient Hospital Stay (HOSPITAL_COMMUNITY)
Admission: RE | Admit: 2019-04-18 | Discharge: 2019-04-21 | DRG: 787 | Disposition: A | Discharge status: Home / Self Care | Payer: Medicaid Other | Attending: Obstetrics & Gynecology | Admitting: Obstetrics & Gynecology

## 2019-04-18 DIAGNOSIS — F319 Bipolar disorder, unspecified: Secondary | ICD-10-CM | POA: Diagnosis not present

## 2019-04-18 DIAGNOSIS — F3181 Bipolar II disorder: Secondary | ICD-10-CM | POA: Diagnosis present

## 2019-04-18 DIAGNOSIS — F411 Generalized anxiety disorder: Secondary | ICD-10-CM | POA: Diagnosis present

## 2019-04-18 DIAGNOSIS — Z880 Allergy status to penicillin: Secondary | ICD-10-CM

## 2019-04-18 DIAGNOSIS — Z3A39 39 weeks gestation of pregnancy: Secondary | ICD-10-CM

## 2019-04-18 DIAGNOSIS — O99344 Other mental disorders complicating childbirth: Secondary | ICD-10-CM | POA: Diagnosis present

## 2019-04-18 DIAGNOSIS — Z23 Encounter for immunization: Secondary | ICD-10-CM

## 2019-04-18 DIAGNOSIS — F1990 Other psychoactive substance use, unspecified, uncomplicated: Secondary | ICD-10-CM | POA: Diagnosis present

## 2019-04-18 DIAGNOSIS — O99334 Smoking (tobacco) complicating childbirth: Secondary | ICD-10-CM | POA: Diagnosis present

## 2019-04-18 DIAGNOSIS — B182 Chronic viral hepatitis C: Secondary | ICD-10-CM | POA: Diagnosis present

## 2019-04-18 DIAGNOSIS — A6000 Herpesviral infection of urogenital system, unspecified: Secondary | ICD-10-CM | POA: Diagnosis present

## 2019-04-18 DIAGNOSIS — Z8619 Personal history of other infectious and parasitic diseases: Secondary | ICD-10-CM | POA: Diagnosis present

## 2019-04-18 DIAGNOSIS — Z8659 Personal history of other mental and behavioral disorders: Secondary | ICD-10-CM

## 2019-04-18 DIAGNOSIS — O9081 Anemia of the puerperium: Secondary | ICD-10-CM | POA: Diagnosis not present

## 2019-04-18 DIAGNOSIS — O99324 Drug use complicating childbirth: Secondary | ICD-10-CM | POA: Diagnosis present

## 2019-04-18 DIAGNOSIS — F17210 Nicotine dependence, cigarettes, uncomplicated: Secondary | ICD-10-CM | POA: Diagnosis present

## 2019-04-18 DIAGNOSIS — O9832 Other infections with a predominantly sexual mode of transmission complicating childbirth: Secondary | ICD-10-CM | POA: Diagnosis present

## 2019-04-18 DIAGNOSIS — O98419 Viral hepatitis complicating pregnancy, unspecified trimester: Secondary | ICD-10-CM | POA: Diagnosis present

## 2019-04-18 DIAGNOSIS — O9842 Viral hepatitis complicating childbirth: Secondary | ICD-10-CM | POA: Diagnosis present

## 2019-04-18 DIAGNOSIS — O34211 Maternal care for low transverse scar from previous cesarean delivery: Principal | ICD-10-CM | POA: Diagnosis present

## 2019-04-18 DIAGNOSIS — O34219 Maternal care for unspecified type scar from previous cesarean delivery: Secondary | ICD-10-CM | POA: Diagnosis present

## 2019-04-18 LAB — CBC
HCT: 30.4 % — ABNORMAL LOW (ref 36.0–46.0)
Hemoglobin: 9.8 g/dL — ABNORMAL LOW (ref 12.0–15.0)
MCH: 27.9 pg (ref 26.0–34.0)
MCHC: 32.2 g/dL (ref 30.0–36.0)
MCV: 86.6 fL (ref 80.0–100.0)
Platelets: 278 10*3/uL (ref 150–400)
RBC: 3.51 MIL/uL — ABNORMAL LOW (ref 3.87–5.11)
RDW: 13.3 % (ref 11.5–15.5)
WBC: 10.2 10*3/uL (ref 4.0–10.5)
nRBC: 0 % (ref 0.0–0.2)

## 2019-04-18 LAB — RPR: RPR Ser Ql: NONREACTIVE

## 2019-04-18 LAB — TYPE AND SCREEN
ABO/RH(D): O POS
Antibody Screen: NEGATIVE

## 2019-04-18 SURGERY — Surgical Case
Anesthesia: Spinal | Site: Abdomen | Wound class: Clean Contaminated

## 2019-04-18 MED ORDER — FENTANYL CITRATE (PF) 100 MCG/2ML IJ SOLN
INTRAMUSCULAR | Status: AC
  Filled 2019-04-18: qty 2

## 2019-04-18 MED ORDER — LACTATED RINGERS IV SOLN
INTRAVENOUS | Status: DC
  Administered 2019-04-18 (×3): via INTRAVENOUS

## 2019-04-18 MED ORDER — DEXAMETHASONE SODIUM PHOSPHATE 10 MG/ML IJ SOLN
INTRAMUSCULAR | Status: AC
  Filled 2019-04-18: qty 1

## 2019-04-18 MED ORDER — COCONUT OIL OIL: 1.0000 "application " | TOPICAL_OIL | Status: DC | PRN

## 2019-04-18 MED ORDER — DIPHENHYDRAMINE HCL 50 MG/ML IJ SOLN
INTRAMUSCULAR | Status: DC | PRN
  Administered 2019-04-18: 6.25 mg via INTRAVENOUS

## 2019-04-18 MED ORDER — STERILE WATER FOR IRRIGATION IR SOLN
Status: DC | PRN
  Administered 2019-04-18: 1

## 2019-04-18 MED ORDER — PANTOPRAZOLE SODIUM 40 MG PO TBEC
40.0000 mg | DELAYED_RELEASE_TABLET | Freq: Every day | ORAL | Status: DC
  Administered 2019-04-19 – 2019-04-21 (×3): 40 mg via ORAL
  Filled 2019-04-18 (×3): qty 1

## 2019-04-18 MED ORDER — SOD CITRATE-CITRIC ACID 500-334 MG/5ML PO SOLN
30.0000 mL | Freq: Once | ORAL | Status: AC
  Administered 2019-04-18: 08:00:00 30 mL via ORAL

## 2019-04-18 MED ORDER — SCOPOLAMINE 1 MG/3DAYS TD PT72
MEDICATED_PATCH | TRANSDERMAL | Status: AC
  Filled 2019-04-18: qty 1

## 2019-04-18 MED ORDER — OXYTOCIN 40 UNITS IN NORMAL SALINE INFUSION - SIMPLE MED
INTRAVENOUS | Status: AC
  Filled 2019-04-18: qty 1000

## 2019-04-18 MED ORDER — KETOROLAC TROMETHAMINE 30 MG/ML IJ SOLN
30.0000 mg | Freq: Four times a day (QID) | INTRAMUSCULAR | Status: AC
  Administered 2019-04-18 – 2019-04-19 (×3): 30 mg via INTRAVENOUS
  Filled 2019-04-18 (×3): qty 1

## 2019-04-18 MED ORDER — SENNOSIDES-DOCUSATE SODIUM 8.6-50 MG PO TABS
2.0000 | ORAL_TABLET | ORAL | Status: DC
  Administered 2019-04-19 – 2019-04-20 (×3): 2 via ORAL
  Filled 2019-04-18 (×3): qty 2

## 2019-04-18 MED ORDER — ONDANSETRON HCL 4 MG/2ML IJ SOLN
INTRAMUSCULAR | Status: DC | PRN
  Administered 2019-04-18: 4 mg via INTRAVENOUS

## 2019-04-18 MED ORDER — GENTAMICIN SULFATE 40 MG/ML IJ SOLN
5.0000 mg/kg | INTRAVENOUS | Status: AC
  Administered 2019-04-18 (×2): 350 mg via INTRAVENOUS
  Filled 2019-04-18 (×2): qty 8.75

## 2019-04-18 MED ORDER — SIMETHICONE 80 MG PO CHEW
80.0000 mg | CHEWABLE_TABLET | Freq: Three times a day (TID) | ORAL | Status: DC
  Administered 2019-04-18 – 2019-04-21 (×8): 80 mg via ORAL
  Filled 2019-04-18 (×8): qty 1

## 2019-04-18 MED ORDER — OXYTOCIN 40 UNITS IN NORMAL SALINE INFUSION - SIMPLE MED: 2.5000 [IU]/h | INTRAVENOUS | Status: AC

## 2019-04-18 MED ORDER — MORPHINE SULFATE (PF) 4 MG/ML IV SOLN
INTRAVENOUS | Status: AC
  Filled 2019-04-18: qty 1

## 2019-04-18 MED ORDER — PHENYLEPHRINE HCL-NACL 20-0.9 MG/250ML-% IV SOLN
INTRAVENOUS | Status: AC
  Filled 2019-04-18: qty 250

## 2019-04-18 MED ORDER — PROMETHAZINE HCL 25 MG/ML IJ SOLN: 6.2500 mg | INTRAMUSCULAR | Status: DC | PRN

## 2019-04-18 MED ORDER — PHENYLEPHRINE 40 MCG/ML (10ML) SYRINGE FOR IV PUSH (FOR BLOOD PRESSURE SUPPORT)
PREFILLED_SYRINGE | INTRAVENOUS | Status: AC
  Filled 2019-04-18: qty 10

## 2019-04-18 MED ORDER — SODIUM CHLORIDE 0.9 % IR SOLN
Status: DC | PRN
  Administered 2019-04-18: 1

## 2019-04-18 MED ORDER — ONDANSETRON HCL 4 MG/2ML IJ SOLN
INTRAMUSCULAR | Status: AC
  Filled 2019-04-18: qty 2

## 2019-04-18 MED ORDER — DIPHENHYDRAMINE HCL 25 MG PO CAPS: 25.0000 mg | ORAL_CAPSULE | Freq: Four times a day (QID) | ORAL | Status: DC | PRN

## 2019-04-18 MED ORDER — OXYTOCIN 10 UNIT/ML IJ SOLN
INTRAMUSCULAR | Status: DC | PRN
  Administered 2019-04-18: 40 [IU]

## 2019-04-18 MED ORDER — DIBUCAINE (PERIANAL) 1 % EX OINT: 1.0000 "application " | TOPICAL_OINTMENT | CUTANEOUS | Status: DC | PRN

## 2019-04-18 MED ORDER — NALBUPHINE HCL 10 MG/ML IJ SOLN: 5.0000 mg | INTRAMUSCULAR | Status: DC | PRN

## 2019-04-18 MED ORDER — SIMETHICONE 80 MG PO CHEW
80.0000 mg | CHEWABLE_TABLET | ORAL | Status: DC
  Administered 2019-04-19 – 2019-04-20 (×3): 80 mg via ORAL
  Filled 2019-04-18 (×3): qty 1

## 2019-04-18 MED ORDER — METOCLOPRAMIDE HCL 5 MG/ML IJ SOLN
INTRAMUSCULAR | Status: AC
  Filled 2019-04-18: qty 2

## 2019-04-18 MED ORDER — MIDAZOLAM HCL 2 MG/2ML IJ SOLN: 0.5000 mg | Freq: Once | INTRAMUSCULAR | Status: DC | PRN

## 2019-04-18 MED ORDER — BUPRENORPHINE HCL 8 MG SL SUBL
8.0000 mg | SUBLINGUAL_TABLET | Freq: Two times a day (BID) | SUBLINGUAL | Status: DC
  Administered 2019-04-18 – 2019-04-21 (×6): 8 mg via SUBLINGUAL
  Filled 2019-04-18 (×6): qty 1

## 2019-04-18 MED ORDER — MENTHOL 3 MG MT LOZG: 1.0000 | LOZENGE | OROMUCOSAL | Status: DC | PRN

## 2019-04-18 MED ORDER — WITCH HAZEL-GLYCERIN EX PADS: 1.0000 "application " | MEDICATED_PAD | CUTANEOUS | Status: DC | PRN

## 2019-04-18 MED ORDER — DEXAMETHASONE SODIUM PHOSPHATE 4 MG/ML IJ SOLN
INTRAMUSCULAR | Status: DC | PRN
  Administered 2019-04-18: 10 mg via INTRAVENOUS

## 2019-04-18 MED ORDER — MORPHINE SULFATE (PF) 0.5 MG/ML IJ SOLN
INTRAMUSCULAR | Status: DC | PRN
  Administered 2019-04-18: .15 ug via INTRATHECAL

## 2019-04-18 MED ORDER — MORPHINE SULFATE (PF) 0.5 MG/ML IJ SOLN
INTRAMUSCULAR | Status: AC
  Filled 2019-04-18: qty 10

## 2019-04-18 MED ORDER — CLINDAMYCIN PHOSPHATE 900 MG/50ML IV SOLN
INTRAVENOUS | Status: AC
  Filled 2019-04-18: qty 50

## 2019-04-18 MED ORDER — GABAPENTIN 100 MG PO CAPS
100.0000 mg | ORAL_CAPSULE | Freq: Once | ORAL | Status: AC | PRN
  Administered 2019-04-18: 100 mg via ORAL
  Filled 2019-04-18: qty 1

## 2019-04-18 MED ORDER — NICOTINE 14 MG/24HR TD PT24
14.0000 mg | MEDICATED_PATCH | Freq: Every day | TRANSDERMAL | Status: DC
  Administered 2019-04-19: 14 mg via TRANSDERMAL
  Filled 2019-04-18 (×4): qty 1

## 2019-04-18 MED ORDER — PROPOFOL 10 MG/ML IV BOLUS
INTRAVENOUS | Status: DC | PRN
  Administered 2019-04-18 (×4): 5 mg via INTRAVENOUS

## 2019-04-18 MED ORDER — IBUPROFEN 800 MG PO TABS
800.0000 mg | ORAL_TABLET | Freq: Three times a day (TID) | ORAL | Status: DC
  Administered 2019-04-19 – 2019-04-21 (×6): 800 mg via ORAL
  Filled 2019-04-18 (×6): qty 1

## 2019-04-18 MED ORDER — MORPHINE SULFATE (PF) 4 MG/ML IV SOLN
1.0000 mg | INTRAVENOUS | Status: DC | PRN
  Administered 2019-04-18: 12:00:00 1 mg via INTRAVENOUS

## 2019-04-18 MED ORDER — FENTANYL CITRATE (PF) 100 MCG/2ML IJ SOLN
INTRAMUSCULAR | Status: DC | PRN
  Administered 2019-04-18: 15 ug via INTRATHECAL

## 2019-04-18 MED ORDER — KETOROLAC TROMETHAMINE 30 MG/ML IJ SOLN
INTRAMUSCULAR | Status: AC
  Filled 2019-04-18: qty 1

## 2019-04-18 MED ORDER — LACTATED RINGERS IV SOLN
INTRAVENOUS | Status: DC
  Administered 2019-04-18: 14:00:00 via INTRAVENOUS

## 2019-04-18 MED ORDER — SCOPOLAMINE 1 MG/3DAYS TD PT72
1.0000 | MEDICATED_PATCH | Freq: Once | TRANSDERMAL | Status: DC
  Administered 2019-04-18: 08:00:00 1.5 mg via TRANSDERMAL

## 2019-04-18 MED ORDER — METOCLOPRAMIDE HCL 5 MG/ML IJ SOLN
INTRAMUSCULAR | Status: DC | PRN
  Administered 2019-04-18: 10 mg via INTRAVENOUS

## 2019-04-18 MED ORDER — MEPERIDINE HCL 25 MG/ML IJ SOLN
6.2500 mg | INTRAMUSCULAR | Status: DC | PRN
  Administered 2019-04-18: 11:00:00 12.5 mg via INTRAVENOUS

## 2019-04-18 MED ORDER — KETOROLAC TROMETHAMINE 30 MG/ML IJ SOLN
30.0000 mg | Freq: Once | INTRAMUSCULAR | Status: AC | PRN
  Administered 2019-04-18: 30 mg via INTRAVENOUS

## 2019-04-18 MED ORDER — DEXAMETHASONE SODIUM PHOSPHATE 4 MG/ML IJ SOLN
INTRAMUSCULAR | Status: AC
  Filled 2019-04-18: qty 1

## 2019-04-18 MED ORDER — OXYCODONE HCL 5 MG PO TABS
5.0000 mg | ORAL_TABLET | ORAL | Status: DC | PRN
  Administered 2019-04-19 – 2019-04-21 (×7): 10 mg via ORAL
  Filled 2019-04-18 (×7): qty 2

## 2019-04-18 MED ORDER — SIMETHICONE 80 MG PO CHEW: 80.0000 mg | CHEWABLE_TABLET | ORAL | Status: DC | PRN

## 2019-04-18 MED ORDER — CLINDAMYCIN PHOSPHATE 900 MG/50ML IV SOLN
900.0000 mg | INTRAVENOUS | Status: AC
  Administered 2019-04-18 (×2): 900 mg via INTRAVENOUS

## 2019-04-18 MED ORDER — BUPRENORPHINE HCL-NALOXONE HCL 8-2 MG SL SUBL: 1.0000 | SUBLINGUAL_TABLET | Freq: Two times a day (BID) | SUBLINGUAL | Status: DC

## 2019-04-18 MED ORDER — MEPERIDINE HCL 25 MG/ML IJ SOLN
INTRAMUSCULAR | Status: AC
  Filled 2019-04-18: qty 1

## 2019-04-18 MED ORDER — ACETAMINOPHEN 325 MG PO TABS
650.0000 mg | ORAL_TABLET | ORAL | Status: DC | PRN
  Administered 2019-04-18 – 2019-04-19 (×3): 650 mg via ORAL
  Filled 2019-04-18 (×3): qty 2

## 2019-04-18 MED ORDER — SOD CITRATE-CITRIC ACID 500-334 MG/5ML PO SOLN
ORAL | Status: AC
  Filled 2019-04-18: qty 30

## 2019-04-18 MED ORDER — BUPRENORPHINE HCL-NALOXONE HCL 8-2 MG SL SUBL: 1.0000 | SUBLINGUAL_TABLET | Freq: Every day | SUBLINGUAL | 0 refills | Status: AC

## 2019-04-18 MED ORDER — SODIUM CHLORIDE 0.9 % IV SOLN
INTRAVENOUS | Status: DC | PRN
  Administered 2019-04-18: 11:00:00 via INTRAVENOUS

## 2019-04-18 MED ORDER — BUPIVACAINE IN DEXTROSE 0.75-8.25 % IT SOLN
INTRATHECAL | Status: DC | PRN
  Administered 2019-04-18: 1.4 mL via INTRATHECAL

## 2019-04-18 MED ORDER — PRENATAL MULTIVITAMIN CH
1.0000 | ORAL_TABLET | Freq: Every day | ORAL | Status: DC
  Administered 2019-04-19 – 2019-04-21 (×3): 1 via ORAL
  Filled 2019-04-18 (×3): qty 1

## 2019-04-18 MED ORDER — ENOXAPARIN SODIUM 40 MG/0.4ML ~~LOC~~ SOLN
40.0000 mg | SUBCUTANEOUS | Status: DC
  Administered 2019-04-19 – 2019-04-21 (×3): 40 mg via SUBCUTANEOUS
  Filled 2019-04-18 (×3): qty 0.4

## 2019-04-18 MED ORDER — PHENYLEPHRINE HCL-NACL 20-0.9 MG/250ML-% IV SOLN
INTRAVENOUS | Status: DC | PRN
  Administered 2019-04-18: 60 ug/min via INTRAVENOUS

## 2019-04-18 SURGICAL SUPPLY — 32 items
BARRIER ADHS 3X4 INTERCEED (GAUZE/BANDAGES/DRESSINGS) IMPLANT
BENZOIN TINCTURE PRP APPL 2/3 (GAUZE/BANDAGES/DRESSINGS) ×4 IMPLANT
CLAMP CORD UMBIL (MISCELLANEOUS) IMPLANT
CLOSURE STERI STRIP 1/2 X4 (GAUZE/BANDAGES/DRESSINGS) ×4 IMPLANT
CLOTH BEACON ORANGE TIMEOUT ST (SAFETY) ×4 IMPLANT
DRSG OPSITE POSTOP 4X10 (GAUZE/BANDAGES/DRESSINGS) ×4 IMPLANT
ELECT REM PT RETURN 9FT ADLT (ELECTROSURGICAL) ×4
ELECTRODE REM PT RTRN 9FT ADLT (ELECTROSURGICAL) ×2 IMPLANT
EXTRACTOR VACUUM KIWI (MISCELLANEOUS) IMPLANT
GLOVE BIO SURGEON STRL SZ 6.5 (GLOVE) ×3 IMPLANT
GLOVE BIO SURGEONS STRL SZ 6.5 (GLOVE) ×1
GLOVE BIOGEL PI IND STRL 7.0 (GLOVE) ×4 IMPLANT
GLOVE BIOGEL PI INDICATOR 7.0 (GLOVE) ×4
GOWN STRL REUS W/TWL LRG LVL3 (GOWN DISPOSABLE) ×8 IMPLANT
KIT ABG SYR 3ML LUER SLIP (SYRINGE) IMPLANT
NEEDLE HYPO 22GX1.5 SAFETY (NEEDLE) IMPLANT
NEEDLE HYPO 25X5/8 SAFETYGLIDE (NEEDLE) IMPLANT
NS IRRIG 1000ML POUR BTL (IV SOLUTION) ×4 IMPLANT
PACK C SECTION WH (CUSTOM PROCEDURE TRAY) ×4 IMPLANT
PAD ABD 7.5X8 STRL (GAUZE/BANDAGES/DRESSINGS) ×4 IMPLANT
PAD OB MATERNITY 4.3X12.25 (PERSONAL CARE ITEMS) ×4 IMPLANT
PENCIL SMOKE EVAC W/HOLSTER (ELECTROSURGICAL) ×4 IMPLANT
RTRCTR C-SECT PINK 25CM LRG (MISCELLANEOUS) IMPLANT
SPONGE GAUZE 4X4 12PLY STER LF (GAUZE/BANDAGES/DRESSINGS) ×8 IMPLANT
SUT VIC AB 0 CT1 36 (SUTURE) ×24 IMPLANT
SUT VIC AB 2-0 CT1 27 (SUTURE) ×2
SUT VIC AB 2-0 CT1 TAPERPNT 27 (SUTURE) ×2 IMPLANT
SUT VIC AB 4-0 PS2 27 (SUTURE) ×4 IMPLANT
SYR CONTROL 10ML LL (SYRINGE) IMPLANT
TOWEL OR 17X24 6PK STRL BLUE (TOWEL DISPOSABLE) ×4 IMPLANT
TRAY FOLEY W/BAG SLVR 14FR LF (SET/KITS/TRAYS/PACK) IMPLANT
WATER STERILE IRR 1000ML POUR (IV SOLUTION) ×4 IMPLANT

## 2019-04-18 NOTE — Op Note (Signed)
Lisa SlateLindsay E Schmitt PROCEDURE DATE: 04/18/2019  PREOPERATIVE DIAGNOSES: Intrauterine pregnancy at 2728w1d weeks gestation; history of prior Cesarean delivery  POSTOPERATIVE DIAGNOSES: The same  PROCEDURE: Repeat Low Transverse Cesarean Section  SURGEON:  Dr. Scheryl DarterJames Arnold ASSISTANT:  Dr. Rhett BannisterLaurel Yarel Kilcrease  ANESTHESIOLOGY TEAM: Anesthesiologist: Jairo BenJackson, Carswell, MD CRNA: Graciela HusbandsFussell, Wynn O, CRNA  INDICATIONS: Lisa SlateLindsay E Schmitt is a 23 y.o. [phone] at 6628w1d here for cesarean section secondary to the indications listed under preoperative diagnoses; please see preoperative note for further details.  The risks of cesarean section were discussed with the patient including but were not limited to: bleeding which may require transfusion or reoperation; infection which may require antibiotics; injury to bowel, bladder, ureters or other surrounding organs; injury to the fetus; need for additional procedures including hysterectomy in the event of a life-threatening hemorrhage; placental abnormalities wth subsequent pregnancies, incisional problems, thromboembolic phenomenon and other postoperative/anesthesia complications.   The patient concurred with the proposed plan, giving informed written consent for the procedure.    FINDINGS:  Viable female infant in cephalic presentation.  Apgars 8 and 9.  Clear amniotic fluid.  Intact placenta, three vessel cord.  Normal uterus, fallopian tubes and ovaries bilaterally.  ANESTHESIA: Spinal INTRAVENOUS FLUIDS: 3000 ml   ESTIMATED BLOOD LOSS: 170 ml URINE OUTPUT:  100 ml SPECIMENS: Placenta sent to L&D COMPLICATIONS: None immediate  PROCEDURE IN DETAIL:  The patient preoperatively received intravenous antibiotics and had sequential compression devices applied to her lower extremities.  She was then taken to the operating room where spinal anesthesia was administered and was found to be adequate. She was then placed in a dorsal supine position with a leftward tilt, and prepped  and draped in a sterile manner.  A foley catheter was placed into her bladder and attached to constant gravity.  After an adequate timeout was performed, a Pfannenstiel skin incision was made with scalpel on her preexisting scarand carried through to the underlying layer of fascia. The fascia was incised in the midline, and this incision was extended bilaterally using the Mayo scissors.  Kocher clamps were applied to the superior aspect of the fascial incision and the underlying rectus muscles were dissected off bluntly.  A similar process was carried out on the inferior aspect of the fascial incision. The rectus muscles were separated in the midline and the peritoneum was entered bluntly. The Alexis self-retaining retractor was introduced into the abdominal cavity.  Attention was turned to the lower uterine segment where a low transverse hysterotomy was made with a scalpel and extended bilaterally bluntly.  The infant was successfully delivered, the cord was clamped and cut after one minute, and the infant was handed over to the awaiting neonatology team. Uterine massage was then administered, and the placenta delivered intact with a three-vessel cord. The uterus was then cleared of clots and debris.  The hysterotomy was closed with 0 Vicryl in a running locked fashion, and an imbricating layer was also placed with 0 Vicryl.  Figure-of-eight 0 Vicryl serosal stitches were placed to help with hemostasis.  The pelvis was cleared of all clot and debris. Hemostasis was confirmed on all surfaces.  The retractor was removed.  The peritoneum was closed with a 2-0 Vicryl running stitch. The fascia was then closed using 0 Vicryl in a running fashion.  The subcutaneous layer was irrigated, and the skin was closed with a 4-0 Vicryl subcuticular stitch. The patient tolerated the procedure well. Sponge, instrument and needle counts were correct x 3.  She was taken  to the recovery room in stable condition.   Cristal Deer. Earlene Plater,  DO OB/GYN Fellow

## 2019-04-18 NOTE — Anesthesia Postprocedure Evaluation (Signed)
Anesthesia Post Note  Patient: TED ALEJANDRO  Procedure(s) Performed: CESAREAN SECTION (N/A Abdomen)     Patient location during evaluation: PACU Anesthesia Type: Spinal Level of consciousness: awake and alert, patient cooperative and oriented Pain management: pain level controlled Vital Signs Assessment: post-procedure vital signs reviewed and stable Respiratory status: spontaneous breathing, nonlabored ventilation and respiratory function stable Cardiovascular status: blood pressure returned to baseline and stable Postop Assessment: no apparent nausea or vomiting, patient able to bend at knees and spinal receding Anesthetic complications: no    Last Vitals:  Vitals:   04/18/19 1245 04/18/19 1305  BP: 120/80 115/61  Pulse: (!) 57 (!) 50  Resp: 12   Temp: 36.6 C 37 C  SpO2: 100%     Last Pain:  Vitals:   04/18/19 1305  TempSrc: Oral  PainSc:    Pain Goal:    LLE Motor Response: Purposeful movement (04/18/19 1245)   RLE Motor Response: Purposeful movement (04/18/19 1245)       Epidural/Spinal Function Cutaneous sensation: Tingles (04/18/19 1245), Patient able to flex knees: No (04/18/19 1245), Patient able to lift hips off bed: No (04/18/19 1245), Back pain beyond tenderness at insertion site: No (04/18/19 1245), Progressively worsening motor and/or sensory loss: No (04/18/19 1245), Bowel and/or bladder incontinence post epidural: No (04/18/19 1245)  Brentley Horrell,E. Edmonia Gonser

## 2019-04-18 NOTE — Anesthesia Procedure Notes (Signed)
Spinal  Patient location during procedure: OR End time: 04/18/2019 10:36 AM Staffing Anesthesiologist: Jairo Ben, MD Performed: anesthesiologist  Preanesthetic Checklist Completed: patient identified, surgical consent, pre-op evaluation, timeout performed, IV checked, risks and benefits discussed and monitors and equipment checked Spinal Block Patient position: sitting Prep: site prepped and draped and DuraPrep Patient monitoring: blood pressure, continuous pulse ox, cardiac monitor and heart rate Approach: midline Location: L3-4 Injection technique: single-shot Needle Needle type: Pencan  Needle gauge: 24 G Needle length: 9 cm Additional Notes Pt identified in Operating room.  Monitors applied. Working IV access confirmed. Sterile prep, drape lumbar spine.  1% lido local L 3,4.  #24ga Pencan into clear CSF L 3,4.  10.5mg  0.75% Bupivacaine with dextrose, fentanyl, morphine injected with asp CSF beginning and end of injection.  Patient asymptomatic, VSS, no heme aspirated, tolerated well.  Sandford Craze, MD

## 2019-04-18 NOTE — H&P (Addendum)
OBSTETRIC ADMISSION HISTORY AND PHYSICAL  Lisa Schmitt is a 23 y.o. female G2P1001 with IUP at 67w1dby L./19 presenting for scheduled repeat C/S.   Reports fetal movement. Denies vaginal bleeding, leakage of fluids.  She received her prenatal care at [facility].  Support person in labor: [employer]. 19w1: EFW 31%, left lateral placenta, complete anatomy scan . 23w2: EFW 47%, normal interval growth . 27w5: EFW 47%, normal interval growth  . 34w5: EFW 59%  Prenatal History/Complications: . Bipolar II disorder . Chronic Hepatitis C . GAD, history of depression . History of HSV . 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 Anxiety   . Bipolar disorder ([place]   . Depression   . Hepatitis C   . Herpes   . Infection   . Scar of abdomen 03/2012  . [place]    Past Surgical History: Past Surgical History:  Procedure Laterality Date  . APPENDECTOMY  age [age]yrs.  . CESAREAN SECTION    . HERNIA REPAIR    . SCAR REVISION  03/22/2012   Procedure: SCAR REVISION;  Surgeon: CTheodoro Kos DO;  [facility]  Service: Plastics;  Laterality: Right;  Excision painful abdominal appendix scar contracture  . TONSILLECTOMY      Obstetrical History: OB History    Gravida  2   Para  1   Term  1   Preterm  0   AB  0   Living  1     SAB  0   TAB  0   Ectopic  0   Multiple  0   Live Births  1           Social History: Social History   Socioeconomic History  . Marital status: Single    Spouse name: Not on file  . Number of children: 1  . Years of education: Not on file  . Highest education level: 12th grade  Occupational History    Comment: [employer] Social Needs  . Financial resource strain: Not very hard  . Food insecurity:    Worry: Never true    Inability: Never true  . Transportation needs:     Medical: No    Non-medical: No  Tobacco Use  . Smoking status: Current Every Day Smoker    Packs/day: 0.50    Types: Cigarettes  . Smokeless tobacco: Never Used  . Tobacco comment: outside smokers at home  Substance and Sexual Activity  . Alcohol use: Not Currently    Comment: Stopped when found out she was pregnant 08/2018  . 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  . Sexual activity: Yes    Birth control/protection: None  Lifestyle  . Physical activity:    Days per week: 0 days    Minutes per session: 0 min  . Stress: To some extent  Relationships  . Social connections:    Talks on phone: Twice a week    Gets together: Once a week    Attends religious service: Never    Active member of club or organization: No    Attends meetings of clubs or organizations: Never    Relationship status: Never married  Other Topics Concern  . Not on file  Social History Narrative   Recently  relapsed with drug use    Family History: Family History  Problem Relation Age of Onset  . Hypertension Mother   . Hyperlipidemia Mother   . Cervical cancer Maternal Grandmother   . Heart disease Maternal Grandmother     Allergies: Allergies  Allergen Reactions  . Penicillins Anaphylaxis and Hives    Did it involve swelling of the face/tongue/throat, SOB, or low BP? Yes Did it involve sudden or severe rash/hives, skin peeling, or any reaction on the inside of your mouth or nose? No Did you need to seek medical attention at a hospital or doctor's office? No - was already in hospital  When did it last happen?childhood allergy If all above answers are "NO", may proceed with cephalosporin use.   . Vicodin [Hydrocodone-Acetaminophen] Hives    Medications Prior to Admission  Medication Sig Dispense Refill Last Dose  . NARCAN 4 MG/0.1ML LIQD nasal spray kit Place 1 spray into both nostrils daily as needed  (opioid overdose).    Taking  . omeprazole (PRILOSEC) 20 MG capsule Take 20 mg by mouth daily.   Taking  . Prenat-FeAsp-Meth-FA-DHA w/o A (PRENATE PIXIE) 10-0.6-0.4-200 MG CAPS Take 1 capsule by mouth daily. 30 capsule 11 Taking  . promethazine (PHENERGAN) 25 MG tablet Take 1 tablet (25 mg total) by mouth every 6 (six) hours as needed for nausea or vomiting. 30 tablet 2 Taking  . valACYclovir (VALTREX) 500 MG tablet Take two tablets by mouth twice daily for ten days; then one tablet twice daily for remainder of pregnancy (Patient taking differently: Take 500 mg by mouth 2 (two) times daily. ) 180 tablet 5 Taking  . buprenorphine-naloxone (SUBOXONE) 8-2 mg SUBL SL tablet Place 1 tablet under the tongue daily.   Taking  . calamine lotion Apply 1 application topically as needed for itching. 120 mL 0 Taking  . nicotine (NICODERM CQ - DOSED IN MG/24 HOURS) 14 mg/24hr patch Place 1 patch (14 mg total) onto the skin daily. 28 patch 0 Taking     Review of Systems  All systems reviewed and negative except as stated in HPI *Exam per Dr. Roselie Awkward* Blood pressure 117/77, pulse 91, temperature 98.3 F (36.8 C), temperature source Oral, height _0  (1.575 m), weight 76.2 kg, last menstrual period 07/18/2018, unknown if currently breastfeeding. General appearance: alert, well-appearing Lungs: no respiratory distress Heart: regular rate  Abdomen: soft, non-tender; gravid  Pelvic: deferred Extremities: SCDs in place  Prenatal labs: ABO, Rh: --/--/O POS (05/18 3559) Antibody: NEG (05/18 7416) Rubella: 3.79 (12/16 1423) RPR: Non Reactive (02/26 0856)  HBsAg: Negative (12/16 1423)  HIV: Non Reactive (02/26 0856)  GBS: Negative (04/24 1158)  Glucola: normal 2-hr Genetic screening:  Low risk NIPS  screen negative AFP  Prenatal Transfer Tool  Maternal Diabetes: No Genetic Screening: Normal Maternal Ultrasounds/Referrals: Normal Fetal Ultrasounds or other Referrals:  Fetal echo - other child with  history of abnormal aortic arch Maternal Substance Abuse:  Yes - history of opiates, benzos, amphetamines, THC, IV drug use, tobacco use Significant Maternal Medications:   Suboxone, omeprazole, phenergan, valtrex, PNV Significant Maternal Lab Results: None  Results for orders placed or performed during the hospital encounter of 04/18/19 (from the past 24 hour(s))  CBC   Collection Time: 04/18/19  7:38 AM  Result Value Ref Range   WBC 10.2 4.0 - 10.5 K/uL   RBC 3.51 (L) 3.87 - 5.11 MIL/uL   Hemoglobin 9.8 (L) 12.0 - 15.0 g/dL   HCT 30.4 (L) 36.0 - 46.0 %  MCV 86.6 80.0 - 100.0 fL   MCH 27.9 26.0 - 34.0 pg   MCHC 32.2 30.0 - 36.0 g/dL   RDW 13.3 11.5 - 15.5 %   Platelets 278 150 - 400 K/uL   nRBC 0.0 0.0 - 0.2 %  Type and screen   Collection Time: 04/18/19  7:38 AM  Result Value Ref Range   ABO/RH(D) O POS    Antibody Screen NEG    Sample Expiration      04/21/2019,2359 Performed at [facility], [place] [address] [place], [place] [address]     Patient Active Problem List   Diagnosis Date Noted  . Unwanted fertility 01/26/2019  . History of ELISA positive for HSV 12/29/2018  . H/O cesarean section complicating pregnancy 34/19/6222  . Bipolar II disorder ([place]) 09/27/2018  . Generalized anxiety disorder 08/06/2018  . 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 Supervision of high risk pregnancy, antepartum 04/24/2016  . Chronic hepatitis C affecting pregnancy, antepartum ([place]) 03/27/2016  . History of breast abscess 03/27/2016  . History of depression 03/27/2016  . Nausea and vomiting during pregnancy 03/27/2016  . Scar of abdominal wall 03/22/2012    Assessment/Plan:  Lisa Schmitt is a 23 y.o. G2P1001 at 52w1dhere for scheduled repeat C/S.  Consent by Dr. ARoselie Awkward  The risks of cesarean section discussed with the patient included but were not limited to: bleeding which may require transfusion or reoperation; infection which  may require antibiotics; injury to bowel, bladder, ureters or other surrounding organs; injury to the fetus; need for additional procedures including hysterectomy in the event of a life-threatening hemorrhage; placental abnormalities wth subsequent pregnancies, incisional problems, thromboembolic phenomenon and other postoperative/anesthesia complications. The patient concurred with the proposed plan, giving informed written consent for the procedure.   Patient has been NPO since last night, and she will remain NPO for procedure. Anesthesia and OR aware. Preoperative prophylactic antibiotics and SCDs ordered on call to the OR.  To OR when ready.  Patient also desires permanent sterilization.  Other reversible forms of contraception were discussed with patient; she declines all other modalities. Risks of procedure discussed with patient including but not limited to: risk of regret, permanence of method, bleeding, infection, injury to surrounding organs and need for additional procedures.  Failure risk of 1-2% with increased risk of ectopic gestation if pregnancy occurs was also discussed with patient.  The patient concurred with the proposed plan, giving informed written consent for the procedures.    Postpartum Planning -- bottle/BTL- has reconsidered and does not want BTL -- RI/_0 Tdap   Laurel S. WJuleen China DO OB/GYN Fellow   Attestation of Attending Supervision of Obstetric Fellow: Evaluation and management procedures were performed by the Obstetric Fellow under my supervision and collaboration.  I have reviewed the Obstetric Fellow's note and chart, and I agree with the management and plan.  JEmeterio Reeve MD, FState CenterAttending ODumas WManiila[facility]

## 2019-04-18 NOTE — Discharge Summary (Signed)
Obstetrics Discharge Summary OB/GYN Faculty Practice   Patient Name: Lisa Schmitt DOB: Aug 01, 1996 MRN: [id number]  Date of admission: 04/18/2019 Delivering MD: Woodroe Mode   Date of discharge: 04/21/2019  Admitting diagnosis: RCS Undesired Fertility Intrauterine pregnancy: [redacted]w[redacted]d    Secondary diagnosis:   Active Problems:   Chronic hepatitis C affecting pregnancy, antepartum ([place]   History of depression   Generalized anxiety disorder   Bipolar II disorder ([place]   H/O cesarean section complicating pregnancy   History of ELISA positive for HSV   Cesarean delivery delivered    Discharge diagnosis: Term Pregnancy Delivered and Hepatitis C, polysubstance use                                 Postpartum procedures: None  Complications: None   Hospital course: Lisa MAGUIREis a 23y.o. 316w1dho was admitted for scheduled repeat C/S. Her pregnancy was complicated by above note. Delivery was uncomplicated. Please see delivery/op note for additional details. Her postpartum course was uncomplicated. She was bottle feeding. A social work consult was placed given history of polysubstance use, bipolar [facility] use during pregnancy, awaiting disposition. By day of discharge, she was passing flatus, urinating, eating and drinking without difficulty. Her pain was well-controlled, and she was discharged home with Percocet- limited amount ordered. She will follow-up in clinic in 2 weeks for incision check and mood check.   Physical exam  Vitals:   04/20/19 1555 04/20/19 2300 04/20/19 2350 04/21/19 0628  BP: 124/73 108/76  113/72  Pulse: 99 67  (!) 57  Resp: 17 18  18   Temp: 97.8 F (36.6 C)  97.7 F (36.5 C) 98 F (36.7 C)  TempSrc: Oral  Axillary Oral  SpO2: 99%   99%  Weight:      Height:       General: alert,cooperative, no distress  Lochia: appropriate Uterine Fundus: firm Incision: Dressing is clean, dry, and  intact DVT Evaluation: No evidence of DVT seen on physical exam. No significant calf/ankle edema.  Labs: Lab Results  Component Value Date   WBC 14.8 (H) 04/19/2019   HGB 8.0 (L) 04/19/2019   HCT 24.6 (L) 04/19/2019   MCV 86.9 04/19/2019   PLT 231 04/19/2019   CMP Latest Ref Rng & Units 04/19/2019  Glucose 65 - 99 mg/dL -  BUN 6 - 20 mg/dL -  Creatinine 0.44 - 1.00 mg/dL 0.60  Sodium 134 - 144 mmol/L -  Potassium 3.5 - 5.2 mmol/L -  Chloride 96 - 106 mmol/L -  CO2 20 - 29 mmol/L -  Calcium 8.7 - 10.2 mg/dL -  Total Protein 6.0 - 8.5 g/dL -  Total Bilirubin 0.0 - 1.2 mg/dL -  Alkaline Phos 39 - 117 IU/L -  AST 0 - 40 IU/L -  ALT 0 - 32 IU/L -    Discharge instructions: Per After Visit Summary and "Baby and Me Booklet"  After visit meds:  Allergies as of 04/21/2019      Reactions   Penicillins Anaphylaxis, Hives   Did it involve swelling of the face/tongue/throat, SOB, or low BP? Yes Did it involve sudden or severe rash/hives, skin peeling, or any reaction on the inside of your mouth or nose? No Did you need to seek medical attention at a hospital or doctor's office? No - was already in hospital  When did it last happen?childhood allergy If all above answers are "NO", may proceed with cephalosporin use.   Vicodin [hydrocodone-acetaminophen] Hives      Medication List    TAKE these medications   buprenorphine-naloxone 8-2 mg Subl SL tablet Commonly known as:  SUBOXONE Place 1 tablet under the tongue daily.   calamine lotion Apply 1 application topically as needed for itching.   Narcan 4 MG/0.1ML Liqd nasal spray kit Generic drug:  naloxone Place 1 spray into both nostrils daily as needed (opioid overdose).   nicotine 14 mg/24hr patch Commonly known as:  NICODERM CQ - dosed in mg/24 hours Place 1 patch (14 mg total) onto the skin daily.   omeprazole 20 MG capsule Commonly known as:  PRILOSEC Take 20 mg by mouth daily.   oxyCODONE 5 MG immediate release  tablet Commonly known as:  Oxy IR/ROXICODONE Take 1 tablet (5 mg total) by mouth every 6 (six) hours as needed for moderate pain.   Prenate Pixie 10-0.6-0.4-200 MG Caps Take 1 capsule by mouth daily.   promethazine 25 MG tablet Commonly known as:  PHENERGAN Take 1 tablet (25 mg total) by mouth every 6 (six) hours as needed for nausea or vomiting.   valACYclovir 500 MG tablet Commonly known as:  VALTREX Take two tablets by mouth twice daily for ten days; then one tablet twice daily for remainder of pregnancy What changed:    how much to take  how to take this  when to take this  additional instructions       Postpartum contraception: Undecided Diet: Routine Diet Activity: Advance as tolerated. Pelvic rest for 6 weeks.   Follow-up Appt: Future Appointments  Date Time Provider [place]  05/03/2019  1:30 PM Chancy Milroy, MD [place] None  05/17/2019  9:00 AM Woodroe Mode, MD [place] None   Follow-up Visit:No follow-ups on file.  Please schedule this patient for Postpartum visit in: 4 weeks with the following provider: Any provider For C/S patients schedule nurse incision check in weeks 2 weeks: yes High  risk pregnancy complicated by: hep c, polysubstance use, bipolar, HSV Delivery mode:  CS Anticipated Birth Control:  other/unsure PP Procedures needed: incision check  Schedule Integrated BH visit: yes  Newborn Data: Live born female  Birth Weight: 6 lb 5.6 oz (2880 g) APGAR: 8, 9  Newborn Delivery   Birth date/time:  04/18/2019 10:59:00 Delivery type:  C-Section, Low Transverse Trial of labor:  No C-section categorization:  Repeat     Baby Feeding: Bottle Disposition:rooming in  Lajean Manes, CNM 04/21/19, 10:53 AM

## 2019-04-18 NOTE — Transfer of Care (Signed)
Immediate Anesthesia Transfer of Care Note  Patient: Lisa Schmitt  Procedure(s) Performed: CESAREAN SECTION (N/A Abdomen)  Patient Location: PACU  Anesthesia Type:Spinal  Level of Consciousness: awake, alert  and oriented  Airway & Oxygen Therapy: Patient Spontanous Breathing  Post-op Assessment: Report given to RN and Post -op Vital signs reviewed and stable  Post vital signs: Reviewed and stable  Last Vitals:  Vitals Value Taken Time  BP 132/81 04/18/2019 12:00 PM  Temp    Pulse 76 04/18/2019 12:07 PM  Resp 16 04/18/2019 12:07 PM  SpO2 99 % 04/18/2019 12:07 PM  Vitals shown include unvalidated device data.  Last Pain:  Vitals:   04/18/19 1152  TempSrc:   PainSc: 5          Complications: No apparent anesthesia complications

## 2019-04-18 NOTE — Anesthesia Preprocedure Evaluation (Addendum)
Anesthesia Evaluation  Patient identified by MRN, date of birth, ID band Patient awake    Reviewed: Allergy & Precautions, NPO status , Patient's Chart, lab work & pertinent test results  History of Anesthesia Complications Negative for: history of anesthetic complications  Airway Mallampati: II  TM Distance: >3 FB Neck ROM: Full    Dental  (+) Dental Advisory Given   Pulmonary Current Smoker,    breath sounds clear to auscultation       Cardiovascular (-) hypertension Rhythm:Regular Rate:Normal     Neuro/Psych Anxiety Depression negative neurological ROS     GI/Hepatic GERD  Medicated and Controlled,(+)     substance abuse (suboxone)  , Hepatitis -, C  Endo/Other  negative endocrine ROS  Renal/GU negative Renal ROS     Musculoskeletal  (+) narcotic dependent  Abdominal   Peds  Hematology plt 278k, Hb 9.8   Anesthesia Other Findings   Reproductive/Obstetrics (+) Pregnancy                            Anesthesia Physical Anesthesia Plan  ASA: III  Anesthesia Plan: Spinal   Post-op Pain Management:    Induction:   PONV Risk Score and Plan: 1 and Ondansetron and Dexamethasone  Airway Management Planned: Natural Airway  Additional Equipment:   Intra-op Plan:   Post-operative Plan:   Informed Consent: I have reviewed the patients History and Physical, chart, labs and discussed the procedure including the risks, benefits and alternatives for the proposed anesthesia with the patient or authorized representative who has indicated his/her understanding and acceptance.     Dental advisory given  Plan Discussed with: CRNA and Surgeon  Anesthesia Plan Comments: (Plan routine monitors, SAB. Pt understands that pain control may be difficult after surgery, given suboxone)       Anesthesia Quick Evaluation

## 2019-04-19 LAB — CBC
HCT: 24.6 % — ABNORMAL LOW (ref 36.0–46.0)
Hemoglobin: 8 g/dL — ABNORMAL LOW (ref 12.0–15.0)
MCH: 28.3 pg (ref 26.0–34.0)
MCHC: 32.5 g/dL (ref 30.0–36.0)
MCV: 86.9 fL (ref 80.0–100.0)
Platelets: 231 10*3/uL (ref 150–400)
RBC: 2.83 MIL/uL — ABNORMAL LOW (ref 3.87–5.11)
RDW: 13.4 % (ref 11.5–15.5)
WBC: 14.8 10*3/uL — ABNORMAL HIGH (ref 4.0–10.5)
nRBC: 0 % (ref 0.0–0.2)

## 2019-04-19 LAB — ABO/RH: ABO/RH(D): O POS

## 2019-04-19 LAB — CREATININE, SERUM
Creatinine, Ser: 0.6 mg/dL (ref 0.44–1.00)
GFR calc Af Amer: >60 mL/min (ref >60)
GFR calc non Af Amer: >60 mL/min (ref >60)

## 2019-04-19 MED ORDER — FERROUS SULFATE 325 (65 FE) MG PO TABS
325.0000 mg | ORAL_TABLET | Freq: Two times a day (BID) | ORAL | Status: DC
  Administered 2019-04-19 – 2019-04-21 (×5): 325 mg via ORAL
  Filled 2019-04-19 (×5): qty 1

## 2019-04-19 NOTE — Progress Notes (Signed)
Subjective: Postpartum Day 1: Cesarean Delivery Patient reports tolerating PO and no problems voiding. Pain well-controlled.    Objective: Vital signs in last 24 hours: Temp:  [97.6 F (36.4 C)-98.2 F (36.8 C)] 98.2 F (36.8 C) (05/19 1423) Pulse Rate:  [54-65] 65 (05/19 1423) Resp:  [16-18] 18 (05/19 1423) BP: (105-122)/(46-72) 114/70 (05/19 1423) SpO2:  [94 %-100 %] 100 % (05/19 1423)  Physical Exam:  General: alert, cooperative, appears older than stated age and no distress Lochia: appropriate Uterine Fundus: firm Incision: Pressure dressing present. No bleeding or drainage visible.  DVT Evaluation: No evidence of DVT seen on physical exam.  Recent Labs    04/18/19 0738 04/19/19 0536  HGB 9.8* 8.0*  HCT 30.4* 24.6*    Assessment/Plan: Status post Cesarean section. Postoperative course complicated by Anemia  Start PO Iron. Undecided BP. Discussed option. Does not want IP LARC. Encouraged at least 2 years between births.  Plan tomorrow or next day.  Baby May stay longer PRN for NAS.   Dorathy Kinsman 04/19/2019, 4:51 PM

## 2019-04-19 NOTE — Clinical Social Work Maternal (Signed)
CLINICAL SOCIAL WORK MATERNAL/CHILD NOTE  Patient Details  Name: Lisa Schmitt MRN: [id number] Date of Birth: 10-Jun-1996  Date:  04/19/2019  Clinical Social Worker Initiating Note:  Ollen Barges Date/Time: Initiated:  04/19/19/0902     Child's Name:  Lisa Schmitt   Biological Parents:  Mother, Father(Lisa Schmitt and Lisa Schmitt DOB: 08/02/1992)   Need for Interpreter:  None   Reason for Referral:  Current Substance Use/Substance Use During Pregnancy , Behavioral Health Concerns   Address:  [place] [address]    Phone number:  [phone] (home)     Additional phone number:  Household Members/Support Persons (HM/SP):   Household Member/Support Person 1   HM/SP Name Relationship DOB or Age  HM/SP -1 [place] (currently living with MGM Lisa Schmitt) Daughter 11/03/2016  HM/SP -2        HM/SP -3        HM/SP -4        HM/SP -5        HM/SP -6        HM/SP -7        HM/SP -8          Natural Supports (not living in the home):  Parent, Spouse/significant other   Professional Supports: None   Employment: Unemployed   Type of Work:     Education:  Programmer, systems   Homebound arranged:    Museum/gallery curator Resources:  Medicaid   Other Resources:  Physicist, medical    Cultural/Religious Considerations Which May Impact Care:    Strengths:  Ability to meet basic needs , Home prepared for child , Pediatrician chosen   Psychotropic Medications:         Pediatrician:    [place] (including Technical sales engineer and surronding areas)  Pediatrician List:   [place]      Pediatrician Fax Number:    Risk Factors/Current Problems:  Mental Health Concerns , Substance Use    Cognitive State:  Able to Concentrate , Alert , Linear Thinking , Insightful    Mood/Affect:  Calm , Comfortable , Interested , Relaxed     CSW Assessment:  CSW received consult due MOB being on suboxone and due to history of bipolar, anxiety, depression.  CSW met with MOB to offer support and complete assessment.    MOB resting in bed, alert, and holding sleeping infant, when CSW entered the room. FOB also present but using the bathroom. CSW introduced self and role and received verbal permission to have FOB step out during assessment. FOB left voluntarily. CSW explained reason for consult and MOB expressed understanding. MOB initially appeared guarded but warmed up as assessment progressed. MOB attentive and appropriate with infant throughout assessment. MOB reported she currently lives alone in [place] and reported she has a 23-year-old daughter who is living with MOB's mother, Lisa Schmitt [phone]. MOB reported that her daughter has been living with [facility] since May of 2018. MOB stated arrangement was mutually agreed upon and denied CPS involvement with placement. MOB reported she visits with her daughter often. MOB stated her highest level of education completed is high school and reported she is not currently employed. MOB confirmed she receives food stamps and intends to apply for [facility] and was informed of process.  CSW inquired about MOB's mental health history and MOB acknowledged having a history  of anxiety since she was a teenager and acknowledged being diagnosed with bipolar II in 12/2017. MOB reported she experiences anxiety and panic attacks primarily and stated she only notices bipolar symptoms when she is using drugs. MOB denied being on any medications or receiving counseling but reported she has been active with [facility] in the past. MOB reported some anxiety and panic attacks during pregnancy but stated it wasn't anything that wasn't manageable and that FOB is a good support for her when symptoms arise. MOB denied any PPD with her first child but was receptive to education. CSW provided education  regarding the baby blues period vs. perinatal mood disorders, discussed treatment and gave resources for mental health follow up if concerns arise.  CSW recommends self-evaluation during the postpartum time period using the New Mom Checklist from Postpartum Progress and encouraged MOB to contact a medical professional if symptoms are noted at any time.  MOB reported being "good" and denied any current mental health symptoms and denied any SI, HI or DV. MOB reported having good support from FOB and her mother.   MOB openly shared with CSW about prior substance use history. 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 MOB reported Dr. Elly Modena currently prescribes her Suboxone but may return to [place] if Dr. Elly Modena is unable to continue to prescribe Suboxone. CSW informed MOB of Hospital Drug Policy due to substance use history and explained UDS came back negative but that CDS was still pending. CSW informed MOB that she would have to make a CPS report due to MOB's substance use during pregnancy. MOB voiced understanding and denied any questions or concerns regarding policy. MOB reported she has a history of CPS involvement in December of 2017 due to being on Subutex but was closed the next month.   MOB confirmed having all essential items for infant once discharged and reported infant would be sleeping in a crib once home. CSW provided review of Sudden Infant Death Syndrome (SIDS) precautions and safe sleeping habits. MOB reported she intends to take infant to [facility] for follow up appointments.     [facility] due to MOB's reported substance use during pregnancy. CSW to await CPS disposition.  CSW Plan/Description:  Sudden Infant Death Syndrome (SIDS) Education, Perinatal Mood and Anxiety  Disorder (PMADs) Education, Hospital Drug Screen Policy Information, Child Protective Service Report , CSW Awaiting CPS Disposition Plan, CSW Will Continue to Monitor Umbilical Cord Tissue Drug Screen Results and Make Report if [place], [place] 04/19/2019, 10:30 AM

## 2019-04-20 NOTE — Progress Notes (Signed)
Subjective: Postpartum Day 2: Cesarean Delivery Patient reports incisional pain, tolerating PO, + flatus and no problems voiding.    Objective: Vital signs in last 24 hours: Temp:  [97.7 F (36.5 C)-98.2 F (36.8 C)] 97.7 F (36.5 C) (05/20 0542) Pulse Rate:  [54-65] 63 (05/20 0542) Resp:  [16-18] 18 (05/20 0542) BP: (108-117)/(61-78) 117/78 (05/20 0542) SpO2:  [94 %-100 %] 100 % (05/19 1423)  Physical Exam:  General: alert, cooperative and no distress Lochia: appropriate Uterine Fundus: firm Incision: no significant drainage DVT Evaluation: No evidence of DVT seen on physical exam. Negative Homan's sign. No cords or calf tenderness. No significant calf/ankle edema.  Recent Labs    04/18/19 0738 04/19/19 0536  HGB 9.8* 8.0*  HCT 30.4* 24.6*    Assessment/Plan: Status post Cesarean section. Doing well postoperatively.  Continue current care.  Levie Heritage 04/20/2019, 7:40 AM

## 2019-04-20 NOTE — Progress Notes (Signed)
CSW informed report has been accepted and assigned to [facility] CPS worker, [employer]. CSW attempted to speak with Karel Jarvis and has left voicemail for return call. CSW will continue to follow.  Archie Balboa, LCSWA  [facility] [phone]

## 2019-04-21 MED ORDER — PNEUMOCOCCAL VAC POLYVALENT 25 MCG/0.5ML IJ INJ
0.5000 mL | INJECTION | Freq: Once | INTRAMUSCULAR | Status: AC
  Administered 2019-04-21: 0.5 mL via INTRAMUSCULAR
  Filled 2019-04-21: qty 0.5

## 2019-04-21 MED ORDER — OXYCODONE HCL 5 MG PO TABS: 5.0000 mg | ORAL_TABLET | Freq: Four times a day (QID) | ORAL | 0 refills | Status: DC | PRN

## 2019-04-21 NOTE — Progress Notes (Signed)
Patient ID: CARRIN BARTZ, female   DOB: [birth year], 23 y.o.   MRN: [id number]  Spoke with staff at [facility] regarding active outpatient prescriptions for both Suboxone and Roxicodone. Explained to pharmacy staff that patient is post-op and prescriptions are appropriate. Staff member verbalized understanding, updated patient's pharmacy record with my comments, and stated that the patient should have no barriers to picking up both prescriptions.  Clayton Bibles, CNM 04/21/19  3:02 PM

## 2019-05-03 ENCOUNTER — Encounter: Payer: Self-pay | Admitting: Obstetrics and Gynecology

## 2019-05-03 ENCOUNTER — Ambulatory Visit (INDEPENDENT_AMBULATORY_CARE_PROVIDER_SITE_OTHER): Payer: Medicaid Other | Admitting: Obstetrics and Gynecology

## 2019-05-03 ENCOUNTER — Other Ambulatory Visit: Payer: Self-pay

## 2019-05-03 DIAGNOSIS — Z9889 Other specified postprocedural states: Secondary | ICD-10-CM

## 2019-05-03 NOTE — Patient Instructions (Signed)

## 2019-05-03 NOTE — Progress Notes (Signed)
Pt is in the office for incision check, c section 04-18-19, pt reports no problems with incision. [place] = 0

## 2019-05-03 NOTE — Progress Notes (Signed)
Lisa Schmitt presents for c section incision check from LTCS on 04/18/19. Post op course was unremarkable She has no complaints today. Denies pain, bowel or bladder dysfunction. Bottle feeding  PE AF VSS Lungs clear Heart RRR Abd soft + BS Incision well healed  A/P S/P LTCS        Incision check Doing well. Increase ADL's as tolerates. F/U in 2 weeks for PP visit

## 2019-05-17 ENCOUNTER — Telehealth (INDEPENDENT_AMBULATORY_CARE_PROVIDER_SITE_OTHER): Payer: Medicaid Other | Admitting: Obstetrics & Gynecology

## 2019-05-17 ENCOUNTER — Other Ambulatory Visit: Payer: Self-pay

## 2019-05-17 ENCOUNTER — Encounter: Payer: Self-pay | Admitting: Obstetrics & Gynecology

## 2019-05-17 DIAGNOSIS — Z1389 Encounter for screening for other disorder: Secondary | ICD-10-CM | POA: Diagnosis not present

## 2019-05-17 DIAGNOSIS — Z30011 Encounter for initial prescription of contraceptive pills: Secondary | ICD-10-CM

## 2019-05-17 MED ORDER — NORGESTIMATE-ETH ESTRADIOL 0.25-35 MG-MCG PO TABS: 1.0000 | ORAL_TABLET | Freq: Every day | ORAL | 11 refills | Status: AC

## 2019-05-17 NOTE — Progress Notes (Signed)
Post Partum Exam Webex visit Lisa Schmitt is a 23 y.o. [id number] female who presents for a postpartum visit. She is 4 weeks postpartum following a low cervical transverse Cesarean section. I have fully reviewed the prenatal and intrapartum course. The delivery was at 34 gestational weeks.  Anesthesia: spinal. Postpartum course has been well. Baby's course has been well. Baby is feeding by Similac 24 calories. Bleeding staining only. Bowel function is normal. Bladder function is normal. Patient is sexually active. Contraception method is condoms. Postpartum depression screening:neg  The following portions of the patient's history were reviewed and updated as appropriate: allergies, current medications, past family history, past medical history, past social history, past surgical history and problem list. Last pap smear done 12/30/18 and was Abnormal- +HPV   Review of Systems Pertinent items are noted in HPI.    Objective:  not currently breastfeeding.  General:  alert, cooperative and no distress       Assessment:    Normal virtual postpartum exam. Pap smear not done at today's visit.   Plan:  HPV test was done in error at pap 12/2018, repeat pap 2023 routine f/u.Sprintec Rx sent   Woodroe Mode, MD 05/17/2019

## 2019-05-26 IMAGING — US US MFM OB DETAIL+14 WK
1 series · 13 of 28 positions shown · non-contrast
Comparison: none

[Series 1: us mfm ob detail+14 wk · 92 acquisitions, 13 frames shown]
[im 4/92]
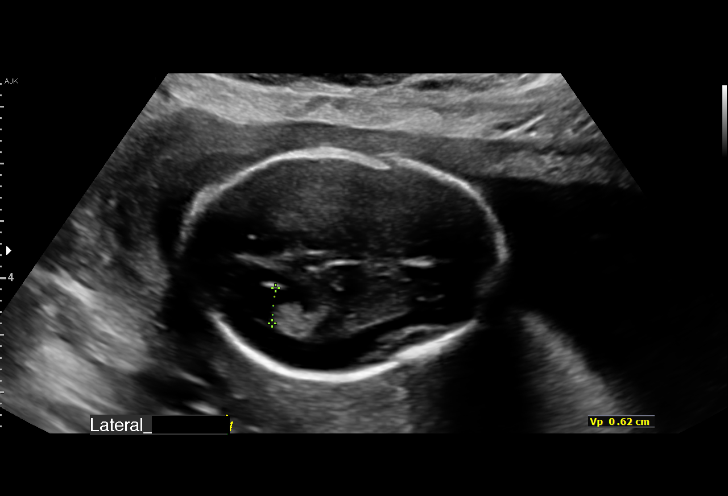
[im 11/92]
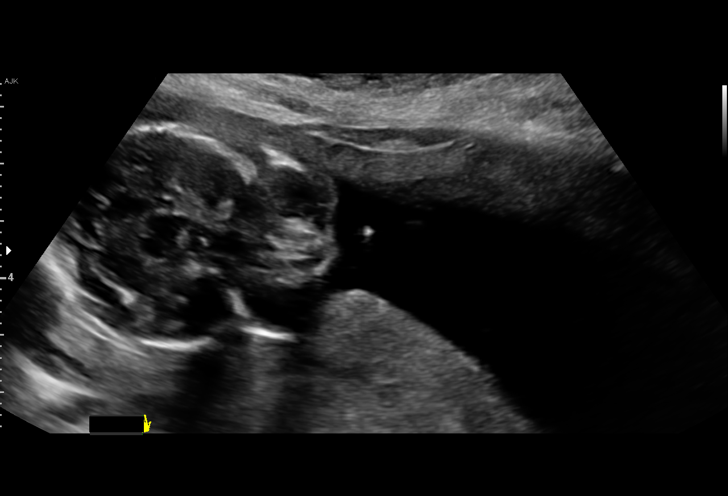
[im 17/92]
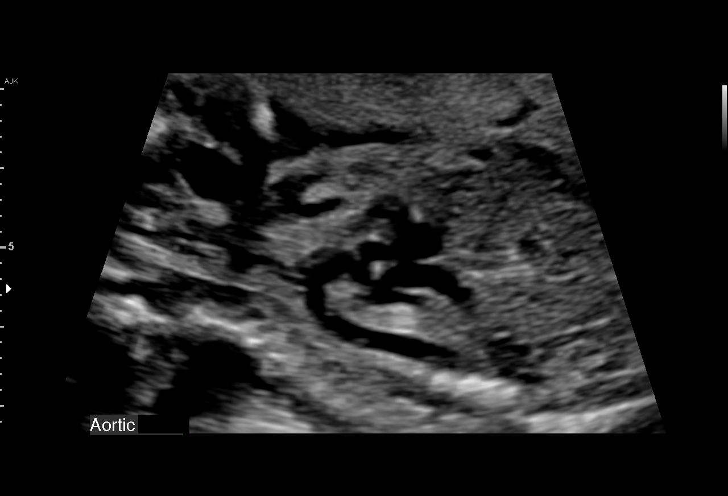
[im 24/92]
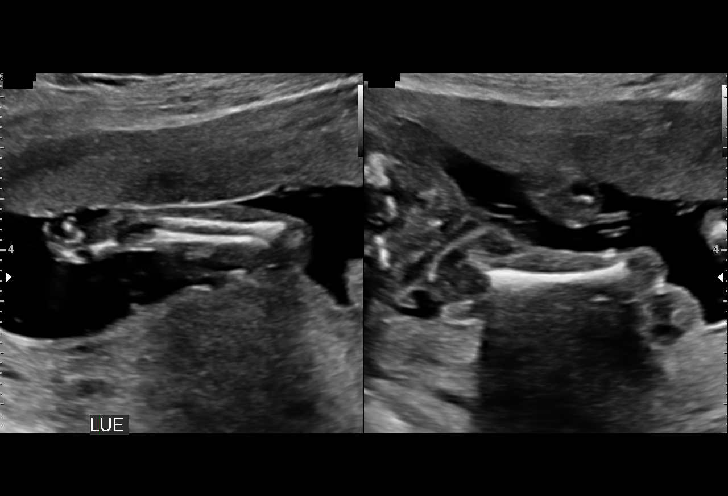
[im 31/92]
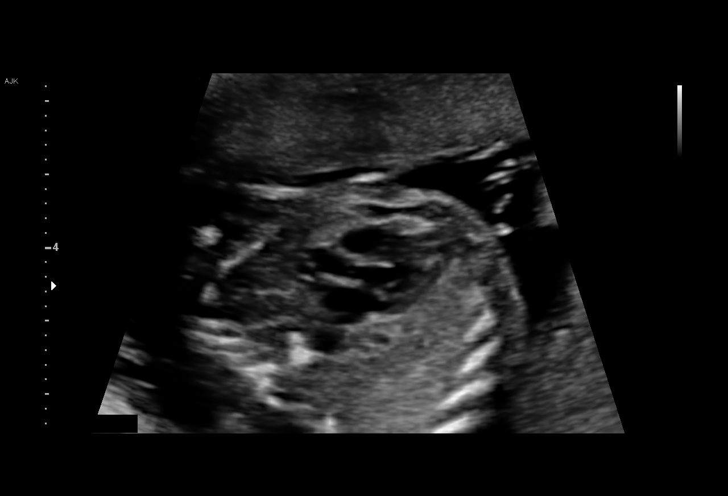
[im 38/92]
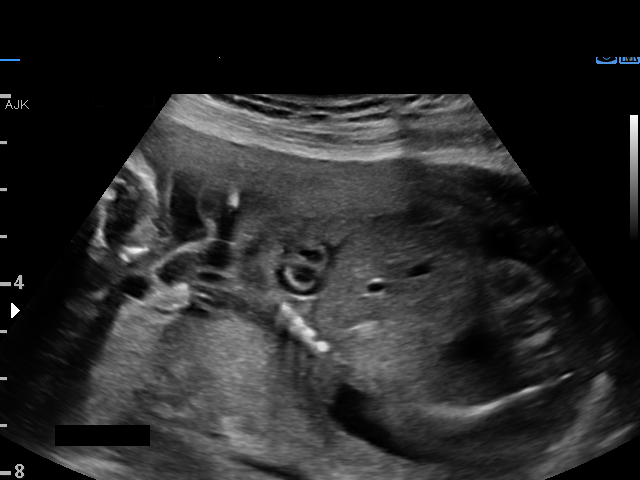
[im 48/92]
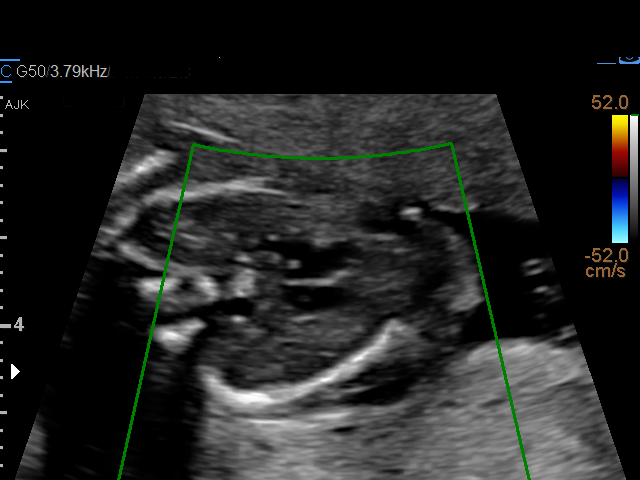
[im 54/92]
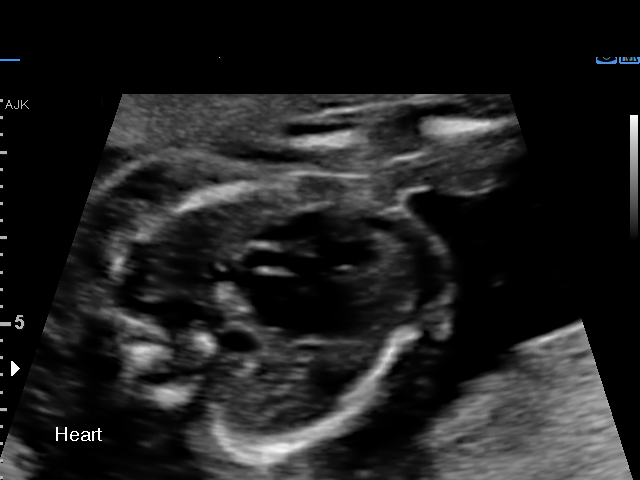
[im 61/92]
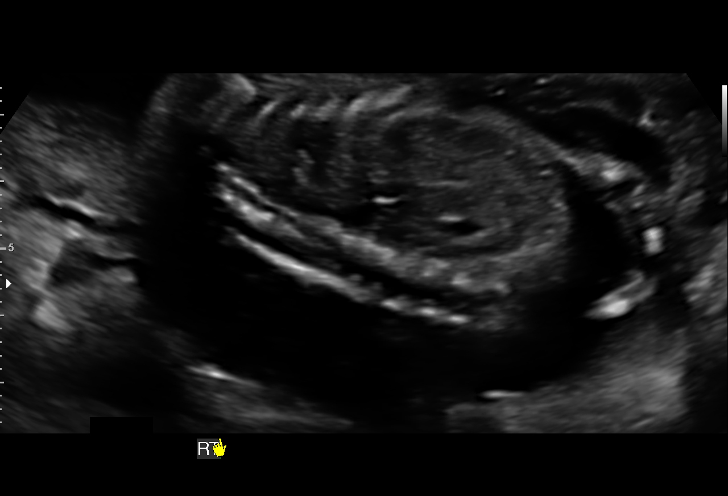
[im 68/92]
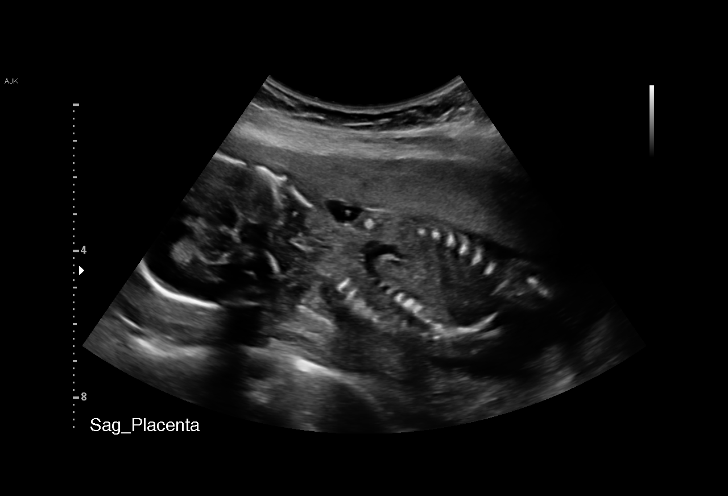
[im 75/92]
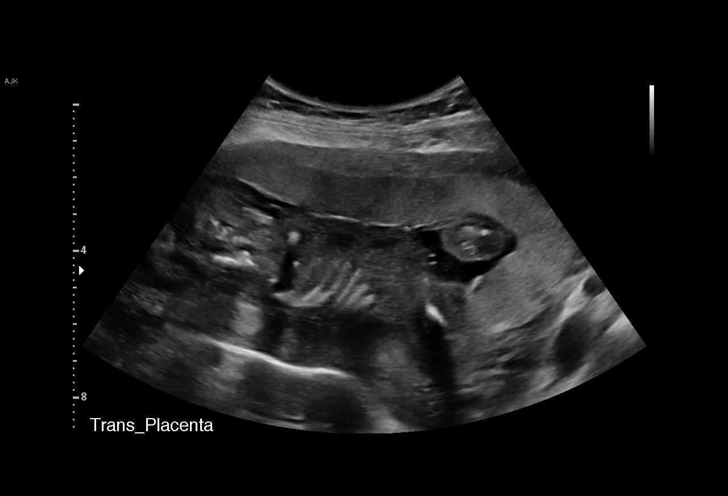
[im 81/92]
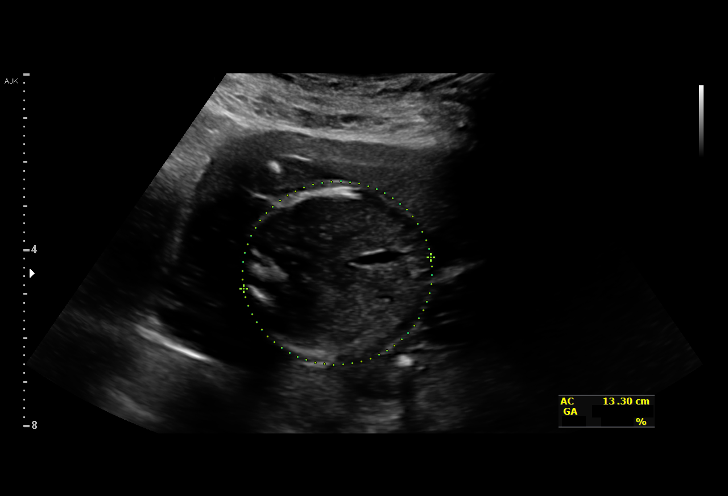
[im 88/92]
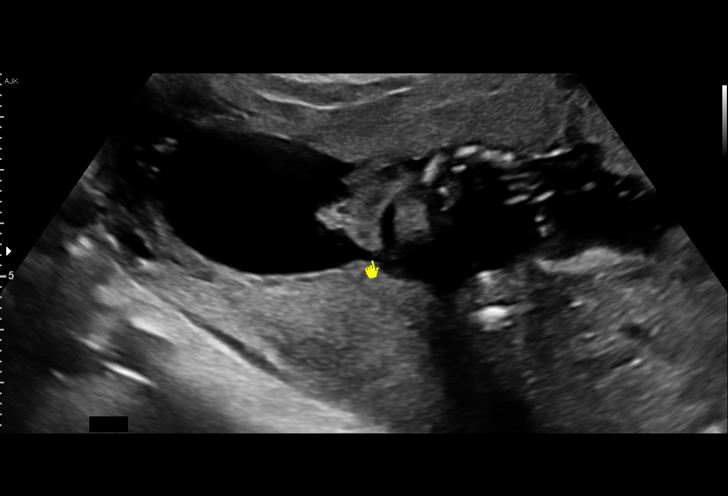

[13 of 28 positions shown; findings below may reference images not displayed]

----------------------------------------------------------------------

 ----------------------------------------------------------------------
Indications

  Encounter for antenatal screening for
  malformations
  Drug use complicating pregnancy, second
  trimester (heroin, ice, [HOSPITAL] meth [DATE])
  Chronic Hepatitis C complicating pregnancy,
  antepartum
  Late prenatal care, second trimester
  Encounter for uncertain dates
  Previous pregnacy with congenital heart
  (cardiac) defect (vascular ring)
  19 weeks gestation of pregnancy
 ----------------------------------------------------------------------
Fetal Evaluation

 Num Of Fetuses:         1
 Fetal Heart Rate(bpm):  142
 Cardiac Activity:       Observed
 Presentation:           Breech
 Placenta:               Left lateral
 P. Cord Insertion:      Visualized

 Amniotic Fluid
 AFI FV:      Within normal limits

                             Largest Pocket(cm)

Biometry

 BPD:        39  mm     G. Age:  17w 6d          7  %    CI:        67.09   %    70 - 86
                                                         FL/HC:      17.4   %    16.1 -
 HC:      152.5  mm     G. Age:  18w 2d          9  %    HC/AC:      1.14        1.09 -
 AC:      133.2  mm     G. Age:  18w 6d         35  %    FL/BPD:     67.9   %
 FL:       26.5  mm     G. Age:  18w 0d         11  %    FL/AC:      19.9   %    20 - 24
 HUM:      26.7  mm     G. Age:  18w 3d         32  %
 CER:      18.9  mm     G. Age:  18w 3d         30  %
 NFT:       4.2  mm

 LV:        6.2  mm
 CM:        5.1  mm

 Est. FW:     239  gm      0 lb 8 oz     31  %
OB History

 Gravidity:    2         Term:   1        Prem:   0        SAB:   0
 TOP:          0       Ectopic:  0        Living: 1
Gestational Age

 LMP:           19w 1d        Date:  07/18/18                 EDD:   04/24/19
 U/S Today:     18w 2d                                        EDD:   04/30/19
 Best:          19w 1d     Det. By:  LMP  (07/18/18)          EDD:   04/24/19
Anatomy

 Cranium:               Appears normal         Aortic Arch:            Appears normal
 Cavum:                 Appears normal         Ductal Arch:            Appears normal
 Ventricles:            Appears normal         Diaphragm:              Appears normal
 Choroid Plexus:        Appears normal         Stomach:                Appears normal, left
                                                                       sided
 Cerebellum:            Appears normal         Abdomen:                Appears normal
 Posterior Fossa:       Appears normal         Abdominal Wall:         Not well visualized
 Nuchal Fold:           Appears normal         Cord Vessels:           Appears normal (3
                                                                       vessel cord)
 Face:                  Appears normal         Kidneys:                Appear normal
                        (orbits and profile)
 Lips:                  Appears normal         Bladder:                Appears normal
 Thoracic:              Appears normal         Spine:                  Not well visualized
 Heart:                 Appears normal         Upper Extremities:      Appears normal
                        (4CH, axis, and
                        situs)
 RVOT:                  Not well visualized    Lower Extremities:      Appears normal
 LVOT:                  Appears normal
Cervix Uterus Adnexa

 Cervix
 Length:           3.96  cm.
 Normal appearance by transabdominal scan.

 Uterus
 No abnormality visualized.

 Left Ovary
 Not visualized.

 Right Ovary
 Not visualized.

 Adnexa
 No abnormality visualized.
Impression

 Patient is here for fetal anatomy scan. She has chronic
 hepatitis C infection and reports using heroin during
 [REDACTED] (relapse). Patient has a new Ob appointment on
 12/03/18 when she will be discussing methadone
 maintenance (prefers suboxone).
 On cell-free fetal DNA screening, she had low risk for fetal
 aneuploidies.
 Obstetric history is significant for a term cesarean delivery in
 2245 of a female infant. She reports her daughter had double
 aortic arch (?) and did not have surgery.
 We performed a fetal anatomy scan. No markers of
 aneuploidies or fetal structural defects are seen. Cardiac
 anatomy appears normal. Fetal biometry is consistent with
 her previously-established dates. Amniotic fluid is normal and
 good fetal activity is seen. Patient understands the limitations
 of ultrasound in detecting fetal anomalies.
Recommendations

 -An appointment was made for her to return in 4 weeks for
 completion of fetal anatomy (RVOT and spine).
 -We will set up an appointment for fetal echocardiography.
                 Med[place], Luuis

## 2019-07-25 IMAGING — US US MFM OB FOLLOW-UP
1 series · 14 of 28 positions shown · non-contrast
Comparison: none

[Series 1: us mfm ob follow-up · 14 of 48 slices shown]
[im 2/48]
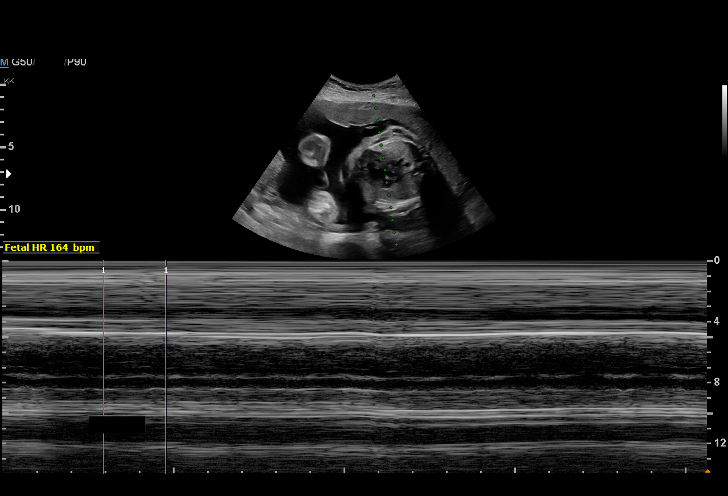
[im 6/48]
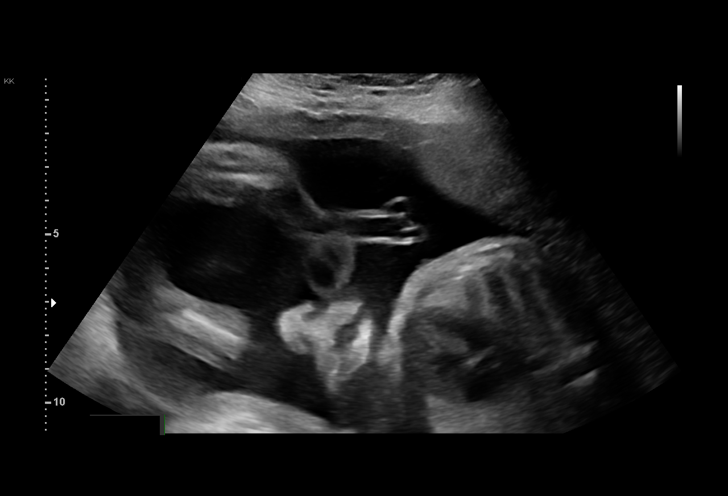
[im 9/48]
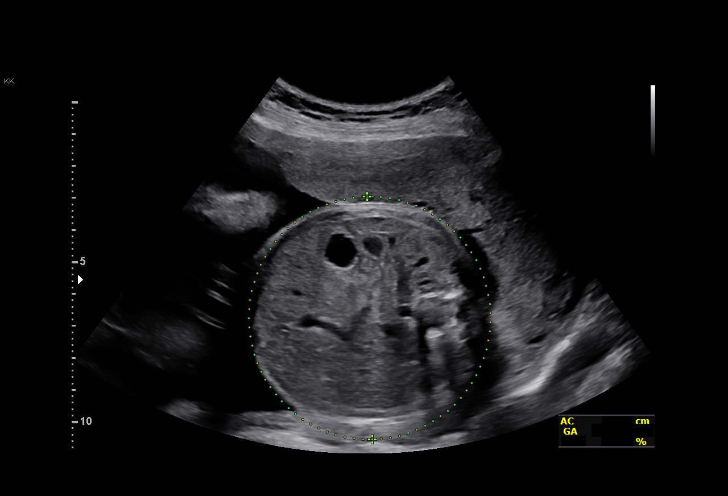
[im 13/48]
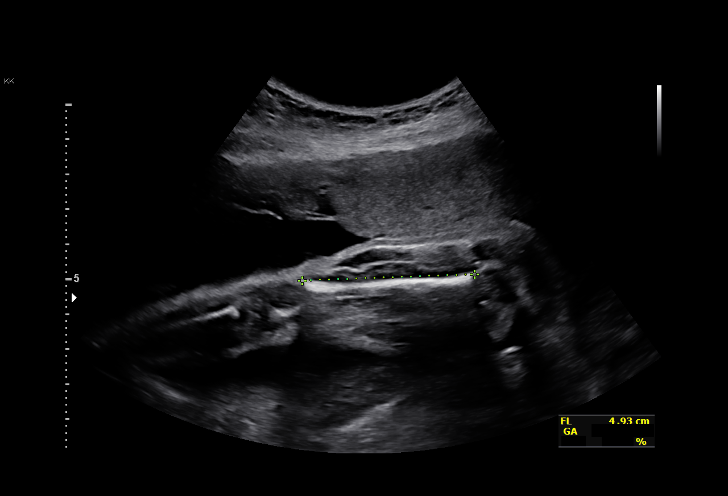
[im 16/48]
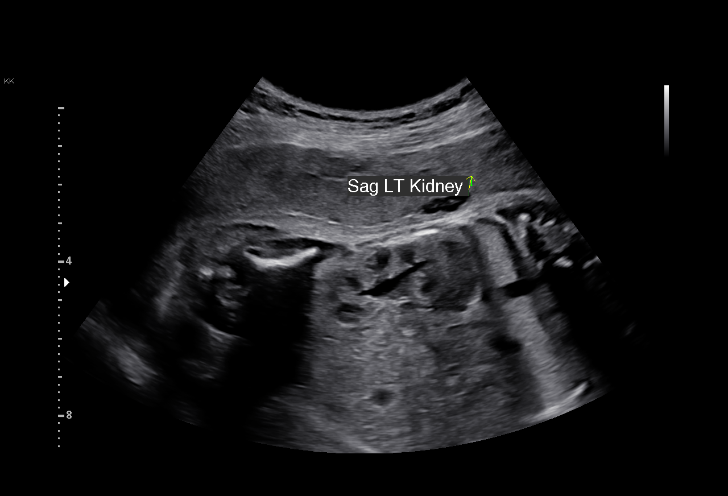
[im 20/48]
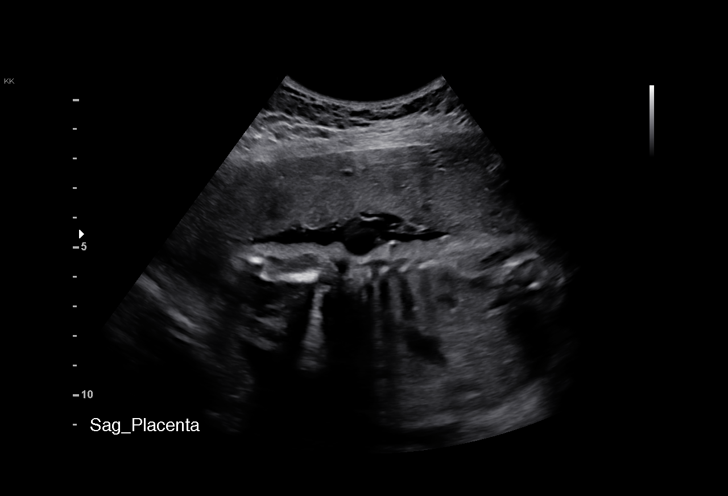
[im 23/48]
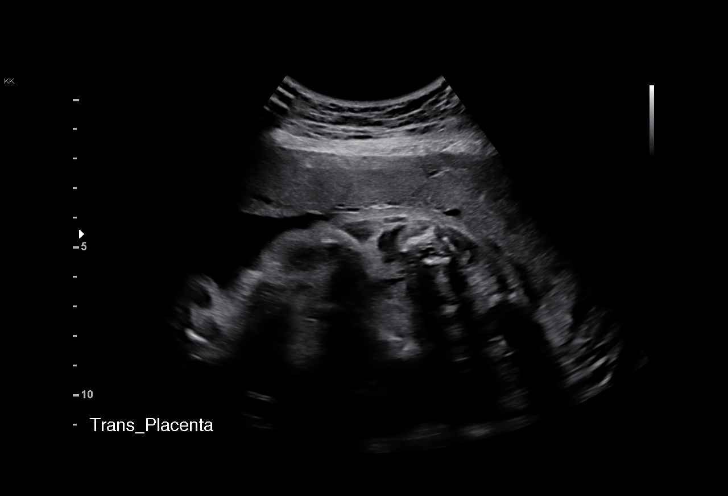
[im 27/48]
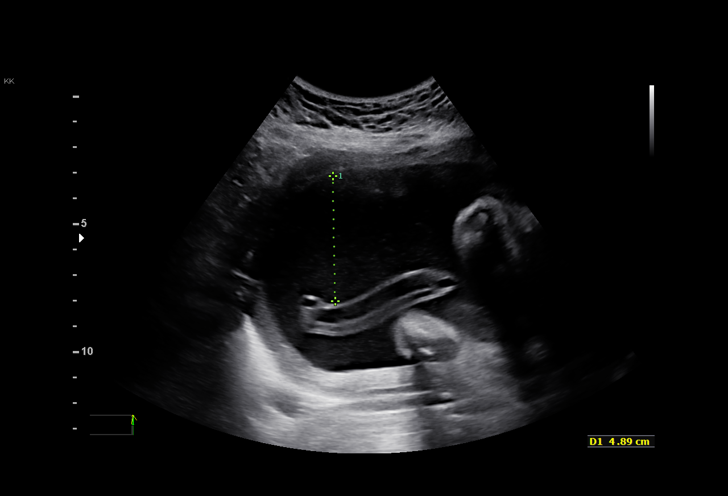
[im 30/48]
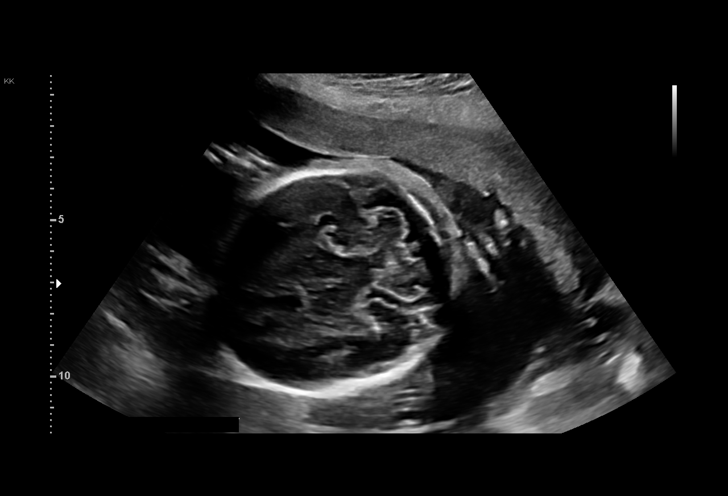
[im 34/48]
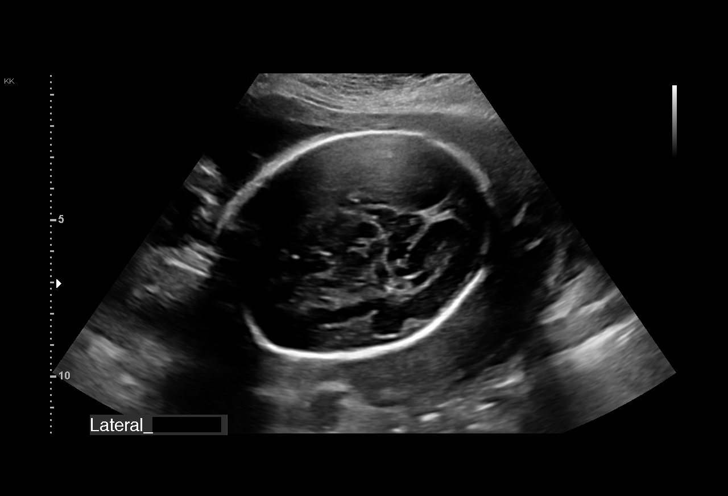
[im 37/48]
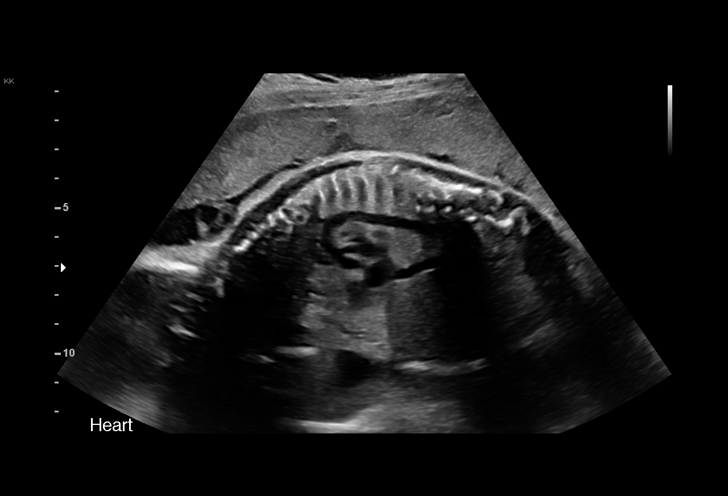
[im 41/48]
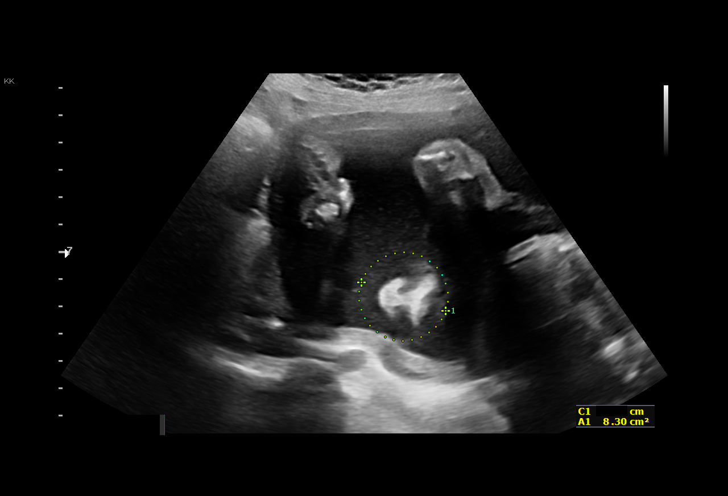
[im 44/48]
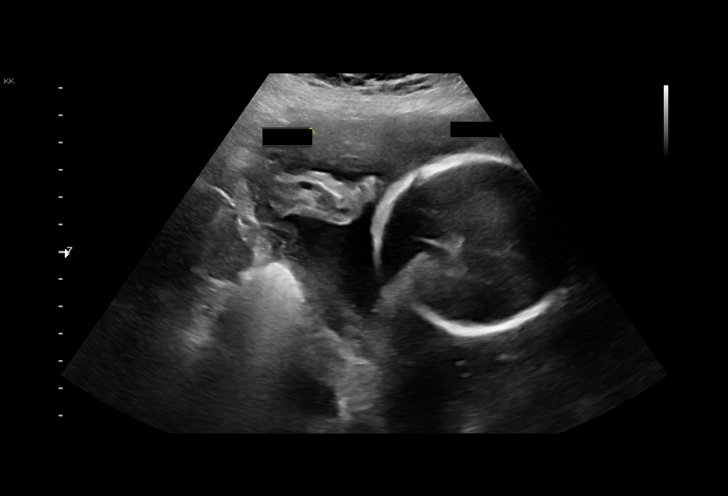
[im 48/48]
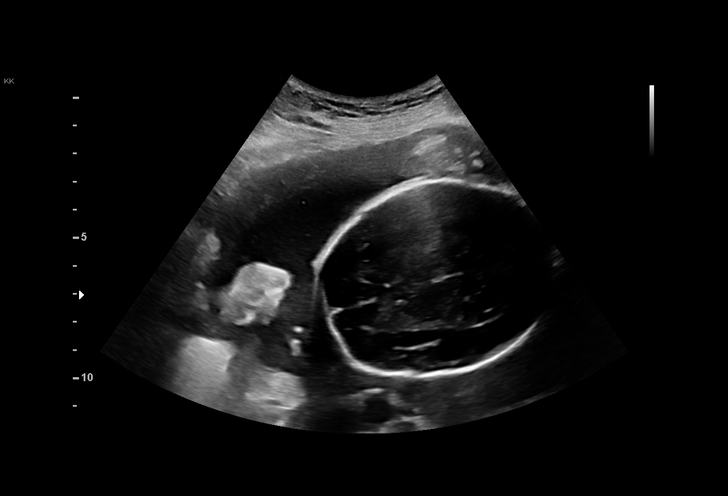

[14 of 28 positions shown; findings below may reference images not displayed]

CNM

 ----------------------------------------------------------------------

 ----------------------------------------------------------------------
Indications

  Drug use complicating pregnancy, second
  trimester (heroin, ice, [HOSPITAL] meth [DATE])
  Chronic Hepatitis C complicating pregnancy,
  antepartum
  Late prenatal care, second trimester
  Previous pregnacy with congenital heart
  (cardiac) defect (vascular ring)
  Suboxone use - starting 12/16/18 no relapse
  since starting as of [DATE]
  27 weeks gestation of pregnancy
 ----------------------------------------------------------------------
Vital Signs

 BMI:
Fetal Evaluation

 Num Of Fetuses:          1
 Fetal Heart Rate(bpm):   164
 Cardiac Activity:        Observed
 Presentation:            Breech
 Placenta:                Anterior
 P. Cord Insertion:       Previously Visualized

 Amniotic Fluid
 AFI FV:      Within normal limits

                             Largest Pocket(cm)

Biometry

 BPD:      69.3   mm     G. Age:  27w 6d         43  %    CI:         73.22  %    70 - 86
                                                          FL/HC:       19.3  %    18.8 -
 HC:      257.4   mm     G. Age:  28w 0d         28  %    HC/AC:       1.09       1.05 -
 AC:      235.5   mm     G. Age:  27w 6d         47  %    FL/BPD:      71.7  %    71 - 87
 FL:       49.7   mm     G. Age:  26w 5d         13  %    FL/AC:       21.1  %    20 - 24

 Est. FW:    3275   gm      2 lb 6 oz     47  %
OB History

 Gravidity:     2         Term:  1          Prem:  0        SAB:   0
 TOP:           0       Ectopic: 0         Living: 1
Gestational Age

 LMP:            27w 5d       Date:  07/18/18                   EDD:  04/24/19
 U/S Today:      27w 4d                                         EDD:  04/25/19
 Best:           27w 5d    Det. By:  LMP  (07/18/18)            EDD:  04/24/19
Anatomy

 Cranium:                Appears normal         Aortic Arch:            Previously seen
 Cavum:                  Appears normal         Ductal Arch:            Previously seen
 Ventricles:             Appears normal         Diaphragm:              Appears normal
 Choroid Plexus:         Previously seen        Stomach:                Appears normal, left
                                                                        sided
 Cerebellum:             Previously seen        Abdomen:                Appears normal
 Posterior Fossa:        Previously seen        Abdominal Wall:         Appears nml (cord
                                                                        insert, abd wall)
 Nuchal Fold:            Previously seen        Cord Vessels:           Previously seen
 Face:                   Orbits and profile     Kidneys:                Appear normal
                         previously seen
 Lips:                   Previously seen        Bladder:                Appears normal
 Thoracic:               Appears normal         Spine:                  Appears normal
 Heart:                  Previously seen        Upper Extremities:      Previously seen
 RVOT:                   Previously seen        Lower Extremities:      Previously seen
 LVOT:                   Previously seen

 Other:   Heels, 5th digits, Nasal bone, and Open hands previously visualized.
          Technically difficult due to fetal position.
Cervix Uterus Adnexa

 Cervix
 Not visualized (advanced GA >38wks)
Impression

 Normal interval growth.
Recommendations

 Follow up growth in 4 weeks.

## 2023-03-22 ENCOUNTER — Encounter (HOSPITAL_BASED_OUTPATIENT_CLINIC_OR_DEPARTMENT_OTHER): Payer: Self-pay | Admitting: Emergency Medicine

## 2023-03-22 ENCOUNTER — Emergency Department (HOSPITAL_BASED_OUTPATIENT_CLINIC_OR_DEPARTMENT_OTHER): Payer: Medicaid Other

## 2023-03-22 ENCOUNTER — Emergency Department (HOSPITAL_BASED_OUTPATIENT_CLINIC_OR_DEPARTMENT_OTHER)
Admission: EM | Admit: 2023-03-22 | Discharge: 2023-03-22 | Disposition: A | Discharge status: Home / Self Care | Payer: Medicaid Other | Attending: Emergency Medicine | Admitting: Emergency Medicine

## 2023-03-22 ENCOUNTER — Other Ambulatory Visit: Payer: Self-pay

## 2023-03-22 DIAGNOSIS — R059 Cough, unspecified: Secondary | ICD-10-CM | POA: Diagnosis not present

## 2023-03-22 DIAGNOSIS — R109 Unspecified abdominal pain: Secondary | ICD-10-CM

## 2023-03-22 DIAGNOSIS — R140 Abdominal distension (gaseous): Secondary | ICD-10-CM | POA: Insufficient documentation

## 2023-03-22 DIAGNOSIS — M545 Low back pain, unspecified: Secondary | ICD-10-CM | POA: Diagnosis not present

## 2023-03-22 LAB — BASIC METABOLIC PANEL
Anion gap: 10 (ref 5–15)
BUN: 6 mg/dL (ref 6–20)
CO2: 23 mmol/L (ref 22–32)
Calcium: 8.9 mg/dL (ref 8.9–10.3)
Chloride: 101 mmol/L (ref 98–111)
Creatinine, Ser: 0.8 mg/dL (ref 0.44–1.00)
GFR, Estimated: >60 mL/min (ref >60)
Glucose, Bld: 103 mg/dL — ABNORMAL HIGH (ref 70–99)
Potassium: 3.6 mmol/L (ref 3.5–5.1)
Sodium: 134 mmol/L — ABNORMAL LOW (ref 135–145)

## 2023-03-22 LAB — CBC
HCT: 37.5 % (ref 36.0–46.0)
Hemoglobin: 13 g/dL (ref 12.0–15.0)
MCH: 33.9 pg (ref 26.0–34.0)
MCHC: 34.7 g/dL (ref 30.0–36.0)
MCV: 97.9 fL (ref 80.0–100.0)
Platelets: 193 10*3/uL (ref 150–400)
RBC: 3.83 MIL/uL — ABNORMAL LOW (ref 3.87–5.11)
RDW: 13.8 % (ref 11.5–15.5)
WBC: 7.5 10*3/uL (ref 4.0–10.5)
nRBC: 0 % (ref 0.0–0.2)

## 2023-03-22 LAB — HEPATIC FUNCTION PANEL
ALT: 19 U/L (ref 0–44)
AST: 30 U/L (ref 15–41)
Albumin: 4.2 g/dL (ref 3.5–5.0)
Alkaline Phosphatase: 80 U/L (ref 38–126)
Bilirubin, Direct: 0.3 mg/dL — ABNORMAL HIGH (ref 0.0–0.2)
Indirect Bilirubin: 0.6 mg/dL (ref 0.3–0.9)
Total Bilirubin: 0.9 mg/dL (ref 0.3–1.2)
Total Protein: 7.7 g/dL (ref 6.5–8.1)

## 2023-03-22 LAB — PREGNANCY, URINE: Preg Test, Ur: NEGATIVE

## 2023-03-22 LAB — URINALYSIS, ROUTINE W REFLEX MICROSCOPIC
Bilirubin Urine: NEGATIVE
Glucose, UA: NEGATIVE mg/dL
Hgb urine dipstick: NEGATIVE
Ketones, ur: NEGATIVE mg/dL
Leukocytes,Ua: NEGATIVE
Nitrite: NEGATIVE
Protein, ur: NEGATIVE mg/dL
Specific Gravity, Urine: 1.01 (ref 1.005–1.030)
pH: 6.5 (ref 5.0–8.0)

## 2023-03-22 LAB — TROPONIN I (HIGH SENSITIVITY): Troponin I (High Sensitivity): 3 ng/L (ref <18)

## 2023-03-22 LAB — LIPASE, BLOOD: Lipase: 32 U/L (ref 11–51)

## 2023-03-22 MED ORDER — KETOROLAC TROMETHAMINE 15 MG/ML IJ SOLN
15.0000 mg | Freq: Once | INTRAMUSCULAR | Status: AC
  Administered 2023-03-22: 15 mg via INTRAVENOUS
  Filled 2023-03-22: qty 1

## 2023-03-22 NOTE — ED Provider Notes (Signed)
[facility] Provider Note   CSN: [id number] Arrival date & time: 03/22/23  0108     History  Chief Complaint  Patient presents with   Back Pain   Shortness of Breath    Lisa Schmitt is a 27 y.o. female.  HPI Patient with history of bipolar, opioid use disorder currently on Suboxone presents with multiple complaints. Patient reports bilateral flank pain for up to 4 days.  She also reports diffuse abdominal pain and bloating.  She reports yesterday she felt like something was sitting on her chest and she felt short of breath. No active chest pain at this time.  She does feel short of breath and anxious.  She reports cough. She reports recent treatment for UTI. No urinary/fecal incontinence.  No leg weakness   Past Medical History:  Diagnosis Date   Anxiety    Bipolar disorder    Depression    Hepatitis C    Herpes    Infection    Scar of abdomen 03/2012   Suicide attempt by drug ingestion 03/2012    Home Medications Prior to Admission medications   Medication Sig Start Date End Date Taking? Authorizing Provider  buprenorphine-naloxone (SUBOXONE) 8-2 mg SUBL SL tablet Place 1 tablet under the tongue daily. Patient not taking: Reported on 05/17/2019 04/18/19   Constant, Peggy, MD  NARCAN 4 MG/0.1ML LIQD nasal spray kit Place 1 spray into both nostrils daily as needed (opioid overdose).  12/23/18   [provider]  norgestimate-ethinyl estradiol (ORTHO-CYCLEN) 0.25-35 MG-MCG tablet Take 1 tablet by mouth daily. 05/17/19   Adam Phenix, MD  omeprazole (PRILOSEC) 20 MG capsule Take 20 mg by mouth daily.    [provider]  Prenat-FeAsp-Meth-FA-DHA w/o A (PRENATE PIXIE) 10-0.6-0.4-200 MG CAPS Take 1 capsule by mouth daily. 01/28/19   Hermina Staggers, MD  valACYclovir (VALTREX) 500 MG tablet Take two tablets by mouth twice daily for ten days; then one tablet twice daily for remainder of pregnancy Patient not taking:  Reported on 05/17/2019 02/22/19   Constant, Peggy, MD      Allergies    Penicillins and Vicodin [hydrocodone-acetaminophen]    Review of Systems   Review of Systems  Constitutional:  Negative for fever.  Respiratory:  Positive for cough and shortness of breath.   Gastrointestinal:  Positive for diarrhea, nausea and vomiting.  Genitourinary:  Positive for flank pain. Negative for dysuria.  Psychiatric/Behavioral:  The patient is nervous/anxious.     Physical Exam Updated Vital Signs BP (!) 145/93 (BP Location: Left Arm)   Pulse (!) 110   Temp 98.8 F (37.1 C) (Oral)   Resp 20   Ht 1.549 m ( )   Wt 77.1 kg   SpO2 99%   BMI 32.12 kg/m  Physical Exam CONSTITUTIONAL: Well developed/well nourished, anxious HEAD: Normocephalic/atraumatic EYES: EOMI/PERRL ENMT: Mucous membranes moist NECK: supple no meningeal signs SPINE/BACK:entire spine nontender, no bruising/crepitance/stepoffs noted to spine CV: S1/S2 noted, no murmurs/rubs/gallops noted LUNGS: Lungs are clear to auscultation bilaterally, no apparent distress ABDOMEN: soft, mild diffuse tenderness, no rebound or guarding, bowel sounds noted throughout abdomen GU: Bilateral cva tenderness, no erythema, no rash no bruising NEURO: Pt is awake/alert/appropriate, moves all extremitiesx4.  No facial droop.  No focal weakness noted in the lower extremities, EXTREMITIES: pulses normal/equal, full ROM SKIN: warm, color normal PSYCH: Anxious  ED Results / Procedures / Treatments   Labs (all labs ordered are listed, but only abnormal results  are displayed) Labs Reviewed  BASIC METABOLIC PANEL - Abnormal; Notable for the following components:      Result Value   Sodium 134 (*)    Glucose, Bld 103 (*)    All other components within normal limits  CBC - Abnormal; Notable for the following components:   RBC 3.83 (*)    All other components within normal limits  URINALYSIS, ROUTINE W REFLEX MICROSCOPIC - Abnormal; Notable for the  following components:   Color, Urine STRAW (*)    All other components within normal limits  HEPATIC FUNCTION PANEL - Abnormal; Notable for the following components:   Bilirubin, Direct 0.3 (*)    All other components within normal limits  PREGNANCY, URINE  LIPASE, BLOOD  TROPONIN I (HIGH SENSITIVITY)    EKG EKG Interpretation  Date/Time:  Sunday March 22 2023 02:03:44 EDT Ventricular Rate:  74 PR Interval:  133 QRS Duration: 98 QT Interval:  385 QTC Calculation: 428 R Axis:   59 Text Interpretation: Sinus rhythm Borderline T abnormalities, anterior leads Confirmed by Zadie Rhine ([id number]) on 03/22/2023 2:33:03 AM  Radiology CT Renal Stone Study  Result Date: 03/22/2023 CLINICAL DATA:  Mid and upper back pain. EXAM: CT ABDOMEN AND PELVIS WITHOUT CONTRAST TECHNIQUE: Multidetector CT imaging of the abdomen and pelvis was performed following the standard protocol without IV contrast. RADIATION DOSE REDUCTION: This exam was performed according to the departmental dose-optimization program which includes automated exposure control, adjustment of the mA and/or kV according to patient size and/or use of iterative reconstruction technique. COMPARISON:  November 28, 2017 FINDINGS: Lower chest: No acute abnormality. Hepatobiliary: No focal liver abnormality is seen. No gallstones, gallbladder wall thickening, or biliary dilatation. Pancreas: Unremarkable. No pancreatic ductal dilatation or surrounding inflammatory changes. Spleen: Normal in size without focal abnormality. Adrenals/Urinary Tract: Adrenal glands are unremarkable. Kidneys are normal, without renal calculi, focal lesion, or hydronephrosis. Bladder is unremarkable. Stomach/Bowel: Stomach is within normal limits. The appendix is surgically absent. No evidence of bowel wall thickening, distention, or inflammatory changes. Vascular/Lymphatic: No significant vascular findings are present. No enlarged abdominal or pelvic lymph nodes.  Reproductive: A properly positioned IUD is seen within a normal appearing uterus. A 1.9 cm diameter cyst is seen within the right adnexa. The left adnexa is unremarkable. Other: No abdominal wall hernia or abnormality. No abdominopelvic ascites. Musculoskeletal: No acute or significant osseous findings. IMPRESSION: Small right adnexal cyst, likely ovarian in origin. No follow-up imaging is recommended. This recommendation follows ACR consensus guidelines: White Paper of the ACR Incidental Findings Committee II on Adnexal Findings. J Am Coll Radiol [phone]. Electronically Signed   By: Aram Candela M.D.   On: 03/22/2023 03:09   DG Chest 2 View  Result Date: 03/22/2023 CLINICAL DATA:  Shortness of breath EXAM: CHEST - 2 VIEW COMPARISON:  08/17/2020 FINDINGS: The heart size and mediastinal contours are within normal limits. Both lungs are clear. The visualized skeletal structures are unremarkable. IMPRESSION: Normal study. Electronically Signed   By: Charlett Nose M.D.   On: 03/22/2023 01:50    Procedures Procedures    Medications Ordered in ED Medications  ketorolac (TORADOL) 15 MG/ML injection 15 mg (15 mg Intravenous Given 03/22/23 0310)    ED Course/ Medical Decision Making/ A&P Clinical Course as of 03/22/23 0341  Sun Mar 22, 2023  0340 Patient presented for multiple complaints, the most of her issues are back and flank pain.  Extensive evaluation Emergency Department is overall unremarkable.  She appears improved.  She now  reports some of the pain is in her low back and radiates into her tailbone.  She has no neurodeficits, she is ambulatory.  No incontinence.  Distant history of IV drug use, but none in the past 4 years.  Low suspicion for discitis or osteomyelitis or epidural abscess. Do not feel emergent MRI is warranted.  Patient will follow-up with her PCP.  We discussed strict return precautions [DW]    Clinical Course User Index [DW] Zadie Rhine, MD                              Medical Decision Making Amount and/or Complexity of Data Reviewed Labs: ordered. Radiology: ordered.  Risk Prescription drug management.   This patient presents to the ED for concern of flank pain, this involves an extensive number of treatment options, and is a complaint that carries with it a high risk of complications and morbidity.  The differential diagnosis includes but is not limited to cholecystitis, cholelithiasis, pancreatitis, gastritis, peptic ulcer disease, appendicitis, bowel obstruction, bowel perforation, diverticulitis,  pyelonephritis, ureteral stone, discitis, epidural abscess, muscle strain    Comorbidities that complicate the patient evaluation: Patient's presentation is complicated by their history of opioid use disorder   Additional history obtained: Records reviewed Care Everywhere/External Records  Lab Tests: I Ordered, and personally interpreted labs.  The pertinent results include: Labs unremarkable  Imaging Studies ordered: I ordered imaging studies including CT scan renal   I independently visualized and interpreted imaging which showed improved I agree with the radiologist interpretation  Medicines ordered and prescription drug management: I ordered medication including Toradol for pain Reevaluation of the patient after these medicines showed that the patient    improved   Reevaluation: After the interventions noted above, I reevaluated the patient and found that they have :improved  Complexity of problems addressed: Patient's presentation is most consistent with  acute presentation with potential threat to life or bodily function  Disposition: After consideration of the diagnostic results and the patient's response to treatment,  I feel that the patent would benefit from discharge   .           Final Clinical Impression(s) / ED Diagnoses Final diagnoses:  Flank pain    Rx / DC Orders ED Discharge Orders     None          Zadie Rhine, MD 03/22/23 [phone]

## 2023-03-22 NOTE — Discharge Instructions (Signed)

## 2023-03-22 NOTE — ED Triage Notes (Signed)
Per pt sharp both side of mid upper back X four days. States just had a UTI and and has been treated for it. Also having SOB and feels like "an elephant sitting on my chest"
